# Patient Record
Sex: Male | Born: 1995 | Race: Black or African American | Hispanic: No | Marital: Single | Smoking: Never smoker
Health system: Southern US, Community
[De-identification: ages and names within clinical notes are randomized; demographics above are authoritative.]

## PROBLEM LIST (undated history)

## (undated) DIAGNOSIS — J45909 Unspecified asthma, uncomplicated: Secondary | ICD-10-CM

---

## 2018-01-20 ENCOUNTER — Encounter (HOSPITAL_COMMUNITY): Payer: Self-pay | Admitting: *Deleted

## 2018-01-20 ENCOUNTER — Emergency Department (HOSPITAL_COMMUNITY)
Admission: EM | Admit: 2018-01-20 | Discharge: 2018-01-20 | Disposition: A | Discharge status: Home / Self Care | Payer: Self-pay | Attending: Emergency Medicine | Admitting: Emergency Medicine

## 2018-01-20 DIAGNOSIS — R51 Headache: Secondary | ICD-10-CM | POA: Insufficient documentation

## 2018-01-20 DIAGNOSIS — J45909 Unspecified asthma, uncomplicated: Secondary | ICD-10-CM | POA: Insufficient documentation

## 2018-01-20 DIAGNOSIS — R519 Headache, unspecified: Secondary | ICD-10-CM

## 2018-01-20 HISTORY — DX: Unspecified asthma, uncomplicated: J45.909

## 2018-01-20 MED ORDER — DEXAMETHASONE 4 MG PO TABS
10.0000 mg | ORAL_TABLET | Freq: Once | ORAL | Status: AC
  Administered 2018-01-20: 10 mg via ORAL
  Filled 2018-01-20: qty 3

## 2018-01-20 MED ORDER — AMOXICILLIN-POT CLAVULANATE 875-125 MG PO TABS: 1.0000 | ORAL_TABLET | Freq: Two times a day (BID) | ORAL | 0 refills | Status: AC

## 2018-01-20 MED ORDER — PROCHLORPERAZINE EDISYLATE 10 MG/2ML IJ SOLN
10.0000 mg | Freq: Once | INTRAMUSCULAR | Status: AC
  Administered 2018-01-20: 10 mg via INTRAMUSCULAR
  Filled 2018-01-20: qty 2

## 2018-01-20 MED ORDER — DIPHENHYDRAMINE HCL 50 MG/ML IJ SOLN
25.0000 mg | Freq: Once | INTRAMUSCULAR | Status: AC
  Administered 2018-01-20: 25 mg via INTRAMUSCULAR
  Filled 2018-01-20: qty 1

## 2018-01-20 NOTE — ED Triage Notes (Signed)
Pt in c/o headache that started yesterday with pain to his eyes, also weakness today, hx of headache but states this feels different, no distress noted

## 2018-01-20 NOTE — ED Provider Notes (Signed)
[facility] EMERGENCY DEPARTMENT Provider Note   CSN: [id number] Arrival date & time: 01/20/18  1010     History   Chief Complaint Chief Complaint  Patient presents with  . Headache    HPI Thomas Merritt is a 22 y.o. male.  22 yo M with a chief complaint of a headache.  This been going on for the past 3 days.  Slow in onset.  Seems to be persistently there, worse with eye movement especially upward.  He denies any double vision or change in vision.  Denies head injury.  Denies vomiting.  Denies neck pain or fevers.  Denies cough or congestion.  Denies history of prior headaches.  Denies difficulty with speech or swallowing denies unilateral numbness or weakness.  The history is provided by the patient.  Headache   This is a new problem. The current episode started more than 2 days ago. The problem occurs constantly. The problem has been gradually worsening. Associated with: eye movement. The pain is located in the frontal region. The quality of the pain is described as dull. The pain is at a severity of 7/10. The pain is moderate. The pain does not radiate. Pertinent negatives include no fever, no palpitations, no shortness of breath and no vomiting. He has tried nothing for the symptoms. The treatment provided no relief.    Past Medical History:  Diagnosis Date  . Asthma     There are no active problems to display for this patient.   History reviewed. No pertinent surgical history.      Home Medications    Prior to Admission medications   Medication Sig Start Date End Date Taking? Authorizing Provider  amoxicillin-clavulanate (AUGMENTIN) 875-125 MG tablet Take 1 tablet by mouth 2 (two) times daily for 10 days. 01/20/18 01/30/18  Melene Plan, DO    Family History History reviewed. No pertinent family history.  Social History Social History   Tobacco Use  . Smoking status: Never Smoker  . Smokeless tobacco: Never Used  Substance Use Topics    . Alcohol use: Not on file  . Drug use: Not on file     Allergies   Patient has no known allergies.   Review of Systems Review of Systems  Constitutional: Negative for chills and fever.  HENT: Negative for congestion and facial swelling.   Eyes: Negative for discharge and visual disturbance.  Respiratory: Negative for shortness of breath.   Cardiovascular: Negative for chest pain and palpitations.  Gastrointestinal: Negative for abdominal pain, diarrhea and vomiting.  Musculoskeletal: Negative for arthralgias and myalgias.  Skin: Negative for color change and rash.  Neurological: Positive for headaches. Negative for tremors and syncope.  Psychiatric/Behavioral: Negative for confusion and dysphoric mood.     Physical Exam Updated Vital Signs BP 131/70   Pulse 78   Temp 98 F (36.7 C) (Oral)   Resp 18   Ht 5\' 9"  (1.753 m)   Wt 77.1 kg (170 lb)   SpO2 100%   BMI 25.10 kg/m   Physical Exam  Constitutional: He is oriented to person, place, and time. He appears well-developed and well-nourished.  HENT:  Head: Normocephalic and atraumatic.  Bilateral effusion to TMs sinus congestion no sinus tenderness to percussion.  Eyes: Pupils are equal, round, and reactive to light. EOM are normal.  Neck: Normal range of motion. Neck supple. No JVD present.  Cardiovascular: Normal rate and regular rhythm. Exam reveals no gallop and no friction rub.  No murmur heard.  Pulmonary/Chest: No respiratory distress. He has no wheezes.  Abdominal: He exhibits no distension. There is no rebound and no guarding.  Musculoskeletal: Normal range of motion.  Neurological: He is alert and oriented to person, place, and time. He has normal strength. No cranial nerve deficit or sensory deficit. He displays a negative Romberg sign. Coordination and gait normal. GCS eye subscore is 4. GCS verbal subscore is 5. GCS motor subscore is 6.  Patient ambulates without difficulty a benign neuro exam  Skin: No  rash noted. No pallor.  Psychiatric: He has a normal mood and affect. His behavior is normal.  Nursing note and vitals reviewed.    ED Treatments / Results  Labs (all labs ordered are listed, but only abnormal results are displayed) Labs Reviewed - No data to display  EKG None  Radiology No results found.  Procedures Procedures (including critical care time)  Medications Ordered in ED Medications  prochlorperazine (COMPAZINE) injection 10 mg (10 mg Intramuscular Given 01/20/18 1310)  diphenhydrAMINE (BENADRYL) injection 25 mg (25 mg Intramuscular Given 01/20/18 1311)  dexamethasone (DECADRON) tablet 10 mg (10 mg Oral Given 01/20/18 1310)     Initial Impression / Assessment and Plan / ED Course  I have reviewed the triage vital signs and the nursing notes.  Pertinent labs & imaging results that were available during my care of the patient were reviewed by me and considered in my medical decision making (see chart for details).     22 yo M with a chief complaint of a headache.  He describes this is behind his eyes and his forehead.  Worse when he looks up.  He denies any double vision denies trauma denies fever denies nasal congestion.  On my exam patient has significant nasal congestion he has no tenderness on percussion to the sinuses.  He has mild effusion to bilateral TMs without erythema or bulging.  I suspect that the patient has sinusitis we will give him a headache cocktail.  Neuro exam is benign.   Patient is feeling much better on reassessment.  Requesting discharge.  Will start on Augmentin for presumed sinusitis.  PCP follow-up.  Patient was given neurology follow-up as needed.  2:05 PM:  I have discussed the diagnosis/risks/treatment options with the patient and family and believe the pt to be eligible for discharge home to follow-up with PCP, neuro. We also discussed returning to the ED immediately if new or worsening sx occur. We discussed the sx which are most  concerning (e.g., sudden worsening pain, fever, inability to tolerate by mouth) that necessitate immediate return. Medications administered to the patient during their visit and any new prescriptions provided to the patient are listed below.  Medications given during this visit Medications  prochlorperazine (COMPAZINE) injection 10 mg (10 mg Intramuscular Given 01/20/18 1310)  diphenhydrAMINE (BENADRYL) injection 25 mg (25 mg Intramuscular Given 01/20/18 1311)  dexamethasone (DECADRON) tablet 10 mg (10 mg Oral Given 01/20/18 1310)      The patient appears reasonably screen and/or stabilized for discharge and I doubt any other medical condition or other [facility] requiring further screening, evaluation, or treatment in the ED at this time prior to discharge.   Final Clinical Impressions(s) / ED Diagnoses   Final diagnoses:  Sinus headache    ED Discharge Orders        Ordered    amoxicillin-clavulanate (AUGMENTIN) 875-125 MG tablet  2 times daily     01/20/18 1403    Ambulatory referral to Neurology  Comments:  New headache   01/20/18 1403       Melene PlanFloyd, Neev Mcmains, DO 01/20/18 1405

## 2018-02-11 ENCOUNTER — Emergency Department (HOSPITAL_COMMUNITY)
Admission: EM | Admit: 2018-02-11 | Discharge: 2018-02-11 | Disposition: A | Discharge status: Home / Self Care | Payer: Medicaid Other | Attending: Emergency Medicine | Admitting: Emergency Medicine

## 2018-02-11 ENCOUNTER — Emergency Department (HOSPITAL_COMMUNITY): Payer: Medicaid Other

## 2018-02-11 ENCOUNTER — Encounter (HOSPITAL_COMMUNITY): Payer: Self-pay | Admitting: Emergency Medicine

## 2018-02-11 ENCOUNTER — Other Ambulatory Visit: Payer: Self-pay

## 2018-02-11 DIAGNOSIS — Y929 Unspecified place or not applicable: Secondary | ICD-10-CM | POA: Insufficient documentation

## 2018-02-11 DIAGNOSIS — Y999 Unspecified external cause status: Secondary | ICD-10-CM | POA: Insufficient documentation

## 2018-02-11 DIAGNOSIS — S60221A Contusion of right hand, initial encounter: Secondary | ICD-10-CM

## 2018-02-11 DIAGNOSIS — S60511A Abrasion of right hand, initial encounter: Secondary | ICD-10-CM | POA: Insufficient documentation

## 2018-02-11 DIAGNOSIS — W228XXA Striking against or struck by other objects, initial encounter: Secondary | ICD-10-CM | POA: Insufficient documentation

## 2018-02-11 DIAGNOSIS — Y939 Activity, unspecified: Secondary | ICD-10-CM | POA: Insufficient documentation

## 2018-02-11 DIAGNOSIS — J45909 Unspecified asthma, uncomplicated: Secondary | ICD-10-CM | POA: Insufficient documentation

## 2018-02-11 MED ORDER — NAPROXEN 500 MG PO TABS: 500.0000 mg | ORAL_TABLET | Freq: Two times a day (BID) | ORAL | 0 refills | Status: DC

## 2018-02-11 MED ORDER — BACITRACIN ZINC 500 UNIT/GM EX OINT
TOPICAL_OINTMENT | Freq: Once | CUTANEOUS | Status: AC
  Administered 2018-02-11: 2 via TOPICAL
  Filled 2018-02-11: qty 1.8

## 2018-02-11 MED ORDER — HYDROCODONE-ACETAMINOPHEN 5-325 MG PO TABS
1.0000 | ORAL_TABLET | Freq: Once | ORAL | Status: AC
  Administered 2018-02-11: 1 via ORAL
  Filled 2018-02-11: qty 1

## 2018-02-11 MED ORDER — IBUPROFEN 200 MG PO TABS
600.0000 mg | ORAL_TABLET | Freq: Once | ORAL | Status: AC
  Administered 2018-02-11: 600 mg via ORAL
  Filled 2018-02-11: qty 1

## 2018-02-11 NOTE — ED Triage Notes (Signed)
Pt to ER for evaluation of right hand pain after punching a wall. Ace wrap in place. Pt in NAD. VSS.

## 2018-02-11 NOTE — ED Provider Notes (Signed)
[facility] EMERGENCY DEPARTMENT Provider Note   CSN: [id number] Arrival date & time: 02/11/18  1451     History   Chief Complaint Chief Complaint  Patient presents with  . Hand Pain    HPI Nikolis Jamar Gladden is a 22 y.o. male who presents to the ED with right hand pain. Patient reports that last night he got upset and hit a wall with his fist. Today there is increased pain and swelling. Patient also reports abrasions to his fingers. He states he is up to date on tetanus.   HPI  Past Medical History:  Diagnosis Date  . Asthma     There are no active problems to display for this patient.   History reviewed. No pertinent surgical history.      Home Medications    Prior to Admission medications   Medication Sig Start Date End Date Taking? Authorizing Provider  naproxen (NAPROSYN) 500 MG tablet Take 1 tablet (500 mg total) by mouth 2 (two) times daily. 02/11/18   Janne Napoleon, NP    Family History History reviewed. No pertinent family history.  Social History Social History   Tobacco Use  . Smoking status: Never Smoker  . Smokeless tobacco: Never Used  Substance Use Topics  . Alcohol use: Not on file  . Drug use: Not on file     Allergies   Patient has no known allergies.   Review of Systems Review of Systems  Musculoskeletal: Positive for arthralgias.  Skin: Positive for wound.  All other systems reviewed and are negative.    Physical Exam Updated Vital Signs BP 128/85 (BP Location: Left Arm)   Pulse 64   Temp 98.1 F (36.7 C) (Oral)   Resp 16   SpO2 100%   Physical Exam  Constitutional: He appears well-developed and well-nourished. No distress.  HENT:  Head: Normocephalic.  Eyes: EOM are normal.  Neck: Normal range of motion. Neck supple.  Cardiovascular: Normal rate.  Pulmonary/Chest: Effort normal.  Musculoskeletal:       Right hand: He exhibits tenderness and swelling. Normal sensation noted. Normal strength  noted. He exhibits no thumb/finger opposition.  Radial pulses 2+, adequate circulation. Superficial wounds to the dorsum of the middle finger and the web space of the 3rd and 4th fingers.   Neurological: He is alert.  Skin: Skin is warm and dry.  Psychiatric: He has a normal mood and affect.  Nursing note and vitals reviewed.    ED Treatments / Results  Labs (all labs ordered are listed, but only abnormal results are displayed) Labs Reviewed - No data to display Radiology Dg Hand Complete Right  Result Date: 02/11/2018 CLINICAL DATA:  Punched wall with hand pain, initial encounter EXAM: RIGHT HAND - COMPLETE 3+ VIEW COMPARISON:  None. FINDINGS: Considerable soft tissue swelling is noted in the hand. No acute fracture or dislocation is noted. No other focal abnormality is seen. IMPRESSION: Soft tissue swelling without bony abnormality. Electronically Signed   By: Alcide Clever M.D.   On: 02/11/2018 15:41    Procedures Procedures (including critical care time)  Medications Ordered in ED Medications  bacitracin ointment (has no administration in time range)  HYDROcodone-acetaminophen (NORCO/VICODIN) 5-325 MG per tablet 1 tablet (has no administration in time range)  ibuprofen (ADVIL,MOTRIN) tablet 600 mg (has no administration in time range)     Initial Impression / Assessment and Plan / ED Course  I have reviewed the triage vital signs and the nursing notes. 22  y.o. male here with right hand pain after he punched a wall last night stable for d/c without fracture or dislocation noted on x-ray. Patient treated for pain while in the ED. Wound care, and bacitracin ointment and dressing. Ace wrap, ice, elevation and NSAIDS. Patient to f/u with ortho if symptoms persist.  Final Clinical Impressions(s) / ED Diagnoses   Final diagnoses:  Contusion of right hand, initial encounter  Abrasion of right hand, initial encounter    ED Discharge Orders        Ordered    naproxen (NAPROSYN) 500  MG tablet  2 times daily     02/11/18 1703       Kerrie Buffaloeese, Evaan Tidwell HaralsonM, [place]NP 02/11/18 1706    Linwood DibblesKnapp, Jon, MD 02/12/18 0021

## 2019-03-06 ENCOUNTER — Telehealth: Payer: Medicaid Other | Admitting: Critical Care Medicine

## 2019-03-06 NOTE — Progress Notes (Deleted)
Patient ID: Thomas Merritt, male   DOB: July 03, 1996, 23 y.o.   MRN: [id number] Virtual Visit via Video Note  I connected with@ on 03/06/19 at@ by a video enabled telemedicine application and verified that I am speaking with the correct person using two identifiers.   Consent:  I discussed the limitations, risks, security and privacy concerns of performing an evaluation and management service by video visit and the availability of in person appointments. I also discussed with the patient that there may be a patient responsible charge related to this service. The patient expressed understanding and agreed to proceed.  Location of patient:  Location of provider:  Persons participating in the televisit with the patient.       History of Present Illness:    Observations/Objective:   Assessment and Plan:   Follow Up Instructions:    I discussed the assessment and treatment plan with the patient. The patient was provided an opportunity to ask questions and all were answered. The patient agreed with the plan and demonstrated an understanding of the instructions.   The patient was advised to call back or seek an in-person evaluation if the symptoms worsen or if the condition fails to improve as anticipated.  I provided *** minutes of non-face-to-face time during this encounter  including  median intraservice time , review of notes, labs, imaging, medications  and explaining diagnosis and management to the patient .    Asencion Noble, MD

## 2019-04-03 ENCOUNTER — Telehealth: Payer: Medicaid Other | Admitting: Critical Care Medicine

## 2019-04-03 NOTE — Progress Notes (Deleted)
Patient ID: [name], male   DOB: 03/16/1996, 23 y.o.   MRN: [id number] Virtual Visit via Video Note  I connected with@ on 04/03/19 at@ by a video enabled telemedicine application and verified that I am speaking with the correct person using two identifiers.   Consent:  I discussed the limitations, risks, security and privacy concerns of performing an evaluation and management service by video visit and the availability of in person appointments. I also discussed with the patient that there may be a patient responsible charge related to this service. The patient expressed understanding and agreed to proceed.  Location of patient:  Location of provider:  Persons participating in the televisit with the patient.       History of Present Illness: In ED 8/4 after hit wall with fist.    have reviewed the triage vital signs and the nursing notes. 23 y.o. male here with right hand pain after he punched a wall last night stable for d/c without fracture or dislocation noted on x-ray. Patient treated for pain while in the ED. Wound care, and bacitracin ointment and dressing. Ace wrap, ice, elevation and NSAIDS. Patient to f/u with ortho if symptoms persist.   Observations/Objective:   Assessment and Plan:   Follow Up Instructions:    I discussed the assessment and treatment plan with the patient. The patient was provided an opportunity to ask questions and all were answered. The patient agreed with the plan and demonstrated an understanding of the instructions.   The patient was advised to call back or seek an in-person evaluation if the symptoms worsen or if the condition fails to improve as anticipated.  I provided *** minutes of non-face-to-face time during this encounter  including  median intraservice time , review of notes, labs, imaging, medications  and explaining diagnosis and management to the patient .    Asencion Noble, MD

## 2019-07-05 ENCOUNTER — Emergency Department (HOSPITAL_COMMUNITY)
Admission: EM | Admit: 2019-07-05 | Discharge: 2019-07-05 | Disposition: A | Discharge status: Home / Self Care | Payer: Medicaid Other | Attending: Emergency Medicine | Admitting: Emergency Medicine

## 2019-07-05 ENCOUNTER — Encounter (HOSPITAL_COMMUNITY): Payer: Self-pay

## 2019-07-05 ENCOUNTER — Other Ambulatory Visit: Payer: Self-pay

## 2019-07-05 DIAGNOSIS — Z20828 Contact with and (suspected) exposure to other viral communicable diseases: Secondary | ICD-10-CM | POA: Insufficient documentation

## 2019-07-05 DIAGNOSIS — J45909 Unspecified asthma, uncomplicated: Secondary | ICD-10-CM | POA: Insufficient documentation

## 2019-07-05 DIAGNOSIS — R45851 Suicidal ideations: Secondary | ICD-10-CM | POA: Insufficient documentation

## 2019-07-05 DIAGNOSIS — R0602 Shortness of breath: Secondary | ICD-10-CM | POA: Insufficient documentation

## 2019-07-05 DIAGNOSIS — F322 Major depressive disorder, single episode, severe without psychotic features: Secondary | ICD-10-CM | POA: Insufficient documentation

## 2019-07-05 DIAGNOSIS — R0789 Other chest pain: Secondary | ICD-10-CM | POA: Insufficient documentation

## 2019-07-05 DIAGNOSIS — F4323 Adjustment disorder with mixed anxiety and depressed mood: Secondary | ICD-10-CM

## 2019-07-05 LAB — RAPID URINE DRUG SCREEN, HOSP PERFORMED
Amphetamines: NOT DETECTED
Barbiturates: NOT DETECTED
Benzodiazepines: NOT DETECTED
Cocaine: NOT DETECTED
Opiates: NOT DETECTED
Tetrahydrocannabinol: NOT DETECTED

## 2019-07-05 LAB — COMPREHENSIVE METABOLIC PANEL
ALT: 18 U/L (ref 0–44)
AST: 21 U/L (ref 15–41)
Albumin: 4.7 g/dL (ref 3.5–5.0)
Alkaline Phosphatase: 59 U/L (ref 38–126)
Anion gap: 6 (ref 5–15)
BUN: 15 mg/dL (ref 6–20)
CO2: 30 mmol/L (ref 22–32)
Calcium: 9.3 mg/dL (ref 8.9–10.3)
Chloride: 101 mmol/L (ref 98–111)
Creatinine, Ser: 0.82 mg/dL (ref 0.61–1.24)
GFR calc Af Amer: >60 mL/min (ref >60)
GFR calc non Af Amer: >60 mL/min (ref >60)
Glucose, Bld: 95 mg/dL (ref 70–99)
Potassium: 3.3 mmol/L — ABNORMAL LOW (ref 3.5–5.1)
Sodium: 137 mmol/L (ref 135–145)
Total Bilirubin: 1.3 mg/dL — ABNORMAL HIGH (ref 0.3–1.2)
Total Protein: 8.1 g/dL (ref 6.5–8.1)

## 2019-07-05 LAB — SALICYLATE LEVEL: Salicylate Lvl: <7 mg/dL — ABNORMAL LOW (ref 7.0–30.0)

## 2019-07-05 LAB — CBC
HCT: 43.1 % (ref 39.0–52.0)
Hemoglobin: 13.9 g/dL (ref 13.0–17.0)
MCH: 31.5 pg (ref 26.0–34.0)
MCHC: 32.3 g/dL (ref 30.0–36.0)
MCV: 97.7 fL (ref 80.0–100.0)
Platelets: 233 10*3/uL (ref 150–400)
RBC: 4.41 MIL/uL (ref 4.22–5.81)
RDW: 11.8 % (ref 11.5–15.5)
WBC: 10.9 10*3/uL — ABNORMAL HIGH (ref 4.0–10.5)
nRBC: 0 % (ref 0.0–0.2)

## 2019-07-05 LAB — RESPIRATORY PANEL BY RT PCR (FLU A&B, COVID)
Influenza A by PCR: NEGATIVE
Influenza B by PCR: NEGATIVE
SARS Coronavirus 2 by RT PCR: NEGATIVE

## 2019-07-05 LAB — ACETAMINOPHEN LEVEL: Acetaminophen (Tylenol), Serum: <10 ug/mL — ABNORMAL LOW (ref 10–30)

## 2019-07-05 LAB — ETHANOL: Alcohol, Ethyl (B): <10 mg/dL (ref <10)

## 2019-07-05 LAB — TROPONIN I (HIGH SENSITIVITY): Troponin I (High Sensitivity): 3 ng/L (ref <18)

## 2019-07-05 MED ORDER — ONDANSETRON HCL 4 MG PO TABS: 4.0000 mg | ORAL_TABLET | Freq: Three times a day (TID) | ORAL | Status: DC | PRN

## 2019-07-05 MED ORDER — ALUM & MAG HYDROXIDE-SIMETH 200-200-20 MG/5ML PO SUSP: 30.0000 mL | Freq: Four times a day (QID) | ORAL | Status: DC | PRN

## 2019-07-05 MED ORDER — ACETAMINOPHEN 325 MG PO TABS
650.0000 mg | ORAL_TABLET | ORAL | Status: DC | PRN
  Administered 2019-07-05: 650 mg via ORAL
  Filled 2019-07-05: qty 2

## 2019-07-05 NOTE — ED Notes (Signed)
Pt has Observation bed at [facility] pending neg COVID-19  WNL cardiac

## 2019-07-05 NOTE — ED Notes (Signed)
TTS: evaluated pt hold for over night observation

## 2019-07-05 NOTE — ED Provider Notes (Addendum)
[place] DEPT Provider Note: Georgena Spurling, MD, FACEP  CSN: [id number] MRN: [id number] ARRIVAL: 07/05/19 at [place]: [place]  Suicidal   HISTORY OF PRESENT ILLNESS  07/05/19 2:35 AM Thomas Merritt is a 23 y.o. male with suicidal thoughts.  He has had increased anxiety over the past week but does not wish to discuss the stressors that her triggering it.  He has had 3 days of chest pressure which is constant.  He has also had intermittent shortness of breath.  He is having suicidal ideation and EMS reports the patient had a knife and he stated he was willing to kill himself.  He rates his chest pressure as a 9 out of 10.   Past Medical History:  Diagnosis Date  . Asthma     History reviewed. No pertinent surgical history.  No family history on file.  Social History   Tobacco Use  . Smoking status: Never Smoker  . Smokeless tobacco: Never Used  Substance Use Topics  . Alcohol use: Not on file  . Drug use: Not on file    Prior to Admission medications   Not on File    Allergies Patient has no known allergies.   REVIEW OF SYSTEMS  Negative except as noted here or in the History of Present Illness.   PHYSICAL EXAMINATION  Initial Vital Signs Blood pressure 133/88, pulse 83, temperature 98.6 F (37 C), temperature source Oral, resp. rate 16, height 5\' 9"  (1.753 m), weight 77.1 kg, SpO2 100 %.  Examination General: Well-developed, well-nourished male in no acute distress; appearance consistent with age of record HENT: normocephalic; atraumatic Eyes: pupils equal, round and reactive to light; extraocular muscles intact Neck: supple Heart: regular rate and rhythm Lungs: clear to auscultation bilaterally Abdomen: soft; nondistended; nontender; bowel sounds present Extremities: No deformity; full range of motion; pulses normal Neurologic: Awake, alert; motor function intact in all extremities and symmetric; no facial droop Skin:  Warm and dry Psychiatric: Depressed mood with congruent affect; suicidal ideation   RESULTS  Summary of this visit's results, reviewed and interpreted by myself:   EKG Interpretation  Date/Time:  Thursday July 05 2019 02:35:14 EST Ventricular Rate:  87 PR Interval:    QRS Duration: 108 QT Interval:  361 QTC Calculation: 435 R Axis:   16 Text Interpretation: Sinus rhythm ST elev, probable normal early repol pattern No previous ECGs available Confirmed by Shanon Rosser [phone]) on 07/05/2019 2:38:27 AM      Laboratory Studies: Results for orders placed or performed during the hospital encounter of 07/05/19 (from the past 24 hour(s))  Comprehensive metabolic panel     Status: Abnormal   Collection Time: 07/05/19  2:39 AM  Result Value Ref Range   Sodium 137 135 - 145 mmol/L   Potassium 3.3 (L) 3.5 - 5.1 mmol/L   Chloride 101 98 - 111 mmol/L   CO2 30 22 - 32 mmol/L   Glucose, Bld 95 70 - 99 mg/dL   BUN 15 6 - 20 mg/dL   Creatinine, Ser 0.82 0.61 - 1.24 mg/dL   Calcium 9.3 8.9 - 10.3 mg/dL   Total Protein 8.1 6.5 - 8.1 g/dL   Albumin 4.7 3.5 - 5.0 g/dL   AST 21 15 - 41 U/L   ALT 18 0 - 44 U/L   Alkaline Phosphatase 59 38 - 126 U/L   Total Bilirubin 1.3 (H) 0.3 - 1.2 mg/dL   GFR calc non Af Amer >60 >60 mL/min  GFR calc Af Amer >60 >60 mL/min   Anion gap 6 5 - 15  Ethanol     Status: None   Collection Time: 07/05/19  2:39 AM  Result Value Ref Range   Alcohol, Ethyl (B) <10 <10 mg/dL  Salicylate level     Status: Abnormal   Collection Time: 07/05/19  2:39 AM  Result Value Ref Range   Salicylate Lvl <7.0 (L) 7.0 - 30.0 mg/dL  Acetaminophen level     Status: Abnormal   Collection Time: 07/05/19  2:39 AM  Result Value Ref Range   Acetaminophen (Tylenol), Serum <10 (L) 10 - 30 ug/mL  cbc     Status: Abnormal   Collection Time: 07/05/19  2:39 AM  Result Value Ref Range   WBC 10.9 (H) 4.0 - 10.5 K/uL   RBC 4.41 4.22 - 5.81 MIL/uL   Hemoglobin 13.9 13.0 - 17.0 g/dL    HCT 28.7 86.7 - 67.2 %   MCV 97.7 80.0 - 100.0 fL   MCH 31.5 26.0 - 34.0 pg   MCHC 32.3 30.0 - 36.0 g/dL   RDW 09.4 70.9 - 62.8 %   Platelets 233 150 - 400 K/uL   nRBC 0.0 0.0 - 0.2 %  Rapid urine drug screen (hospital performed)     Status: None   Collection Time: 07/05/19  2:39 AM  Result Value Ref Range   Opiates NONE DETECTED NONE DETECTED   Cocaine NONE DETECTED NONE DETECTED   Benzodiazepines NONE DETECTED NONE DETECTED   Amphetamines NONE DETECTED NONE DETECTED   Tetrahydrocannabinol NONE DETECTED NONE DETECTED   Barbiturates NONE DETECTED NONE DETECTED  Troponin I (High Sensitivity)     Status: None   Collection Time: 07/05/19  2:40 AM  Result Value Ref Range   Troponin I (High Sensitivity) 3 <18 ng/L  Respiratory Panel by RT PCR (Flu A&B, Covid) - Nasopharyngeal Swab     Status: None   Collection Time: 07/05/19  3:55 AM   Specimen: Nasopharyngeal Swab  Result Value Ref Range   SARS Coronavirus 2 by RT PCR NEGATIVE NEGATIVE   Influenza A by PCR NEGATIVE NEGATIVE   Influenza B by PCR NEGATIVE NEGATIVE   Imaging Studies: No results found.  ED COURSE and MDM  Nursing notes, initial and subsequent vitals signs, including pulse oximetry, reviewed and interpreted by myself.  Vitals:   07/05/19 0228 07/05/19 0237 07/05/19 0650  BP:  133/88 (!) 111/48  Pulse:  83 79  Resp:  16 16  Temp:  98.6 F (37 C) 98.3 F (36.8 C)  TempSrc:  Oral   SpO2: 98% 100% 98%  Weight:  77.1 kg   Height:  5\' 9"  (1.753 m)    Medications  acetaminophen (TYLENOL) tablet 650 mg (has no administration in time range)  ondansetron (ZOFRAN) tablet 4 mg (has no administration in time range)  alum & mag hydroxide-simeth (MAALOX/MYLANTA) 200-200-20 MG/5ML suspension 30 mL (has no administration in time range)   4:27 AM Awaiting TTS evaluation.  PROCEDURES  Procedures   ED DIAGNOSES     ICD-10-CM   1. Suicidal ideation  R45.851        , MD 07/05/19 0427    07/07/19, MD 07/05/19 [phone]

## 2019-07-05 NOTE — ED Notes (Signed)
Patients personal items in belongings bag, patient dressed out into burgundy scrubs and yellow socks, wanded by security.

## 2019-07-05 NOTE — Discharge Instructions (Signed)
For your ongoing behavioral health needs you are advised to follow up at your earliest opportunity with an outpatient therapist.  [facility] offers these services to students.  Contact them at the number listed below.  Alternatives are also listed:       [facility]      [address]      [phone]       [facility] [place], [place] [address]      [phone]      New patients are seen at their walk-in clinic.  Walk-in hours are Monday - Friday from 8:30 am - 12:00 pm, and from 1:00 pm - 2:30 pm.  Walk-in patients are seen on a first come, first served basis, so try to arrive as early as possible for the best chance of being seen the same day.       [facility]      [place],  [address]      [phone]

## 2019-07-05 NOTE — BH Assessment (Signed)
[place] Assessment Progress Note  Per Hampton Abbot, MD and Earleen Newport, FNP, this pt does not require psychiatric hospitalization at this time.  Pt is to be discharged from [facility] with recommendation to follow up with an outpatient therapist.  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  Discharge instructions include contact information for the [place] [facility], along with [place] and the [facility] as alternatives.  Pt's nurse, Erline Levine, has been notified.  Jalene Mullet, [place] Triage Specialist [phone]

## 2019-07-05 NOTE — BH Assessment (Signed)
Tele Assessment Note   Patient Name: Thomas MangleDenzel Jamar Merritt MRN: [id number] Referring Physician: Dr. Paula LibraJohn Molpus. Location of Patient: [facility], [phone].  Location of Provider: Behavioral Health TTS Department  Thomas Merritt is an 23 y.o. male, who [facility] voluntary and [facility]accompan[facility]. Clinician asked the pt, "what brought you to the hospital?" Pt reported, "a lot problems going on." Pt reported, he knew his (now) ex-girlfriend since 2015, and in 2017 they began dating secretly because he does not like people in his business. Pt reported, he has lied and cheated during their relationship but he and his ex eventually made up. Pt reported, he and his ex-girlfriend said they were going to be celibate but he cheated and on 06/16/2019 his ex said she was done and broke up with the pt. Pt reported, two days ago, he found out his ex had a boyfriend. Per pt, in the past his ex would say she was done after he had cheated, she would talk to other guys but nothing serious. Pt reported, he talked to his ex  to get closure and learned his ex and her boyfriend were having sex. Pt reported, missing his father (who passed away in 2013) and wanting his guidance on how to deal with relationship problems. Pt reported, he put a knife to his neck. Pt reported, he wanted to do it then he didn't want to do it. Pt reported, he needed to get out if his house and called 911. Pt denies, HI, AVH, self-injurious behaviors.   Pt denies, history of abuse. Pt denies, substance use. Pt's UDS is pending. Pt denies, being linked to OPT resources (medication management and/or counseling.) Pt reported, wanting a emotional support animal and how to start the process. Pt denies, previous inpatient admissions.   Pt presents alert wrapped in cover with logical, coherent speech. Pt's mood, affect was depressed. Pt's thought process was coherent, relevant. Pt was oriented x4. Pt's concentration was normal. Pt's insight was good.  Pt's impulse control was poor. Pt reported, [facility] he could contract for safety. Clinician discussed the three possible dispositions (discharged with OPT resources, observe/reassess by psychiatry or inpatient treatment) in detail. Pt reported, if inpatient treatment is recommended he would sign-in voluntarily.  *Pt declined having family, friend support to be called to obtain collateral information.*   Diagnosis: Major Depressive Disorder, single, severe without psychotic features.   Past Medical History:  Past Medical History:  Diagnosis Date  . Asthma     History reviewed. No pertinent surgical history.  Family History: No family history on Merritt.  Social History:  reports that he has never smoked. He has never used smokeless tobacco. No history on Merritt for alcohol and drug.  Additional Social History:  Alcohol / Drug Use Pain Medications: See MAR Prescriptions: See MAR Over the Counter: See MAR History of alcohol / drug use?: (Pt denies use. UDS is pending.)  CIWA: CIWA-Ar BP: 133/88 Pulse Rate: 83 COWS:    Allergies: No Known Allergies  Home Medications: (Not in a hospital admission)   OB/GYN Status:  No LMP for male patient.  General Assessment Data Location of Assessment: [facility] TTS Assessment: In system Is this a Tele or Face-to-Face Assessment?: Tele Assessment Is this an Initial Assessment or a Re-assessment for this encounter?: Initial Assessment Patient Accompanied by:: N/A Language Other than English: No Living Arrangements: Other (Comment)(Three room mates. ) Marital status: Single Living Arrangements: Other relatives Can pt return to current living arrangement?:  Yes Admission Status: Voluntary Is patient capable of signing voluntary admission?: Yes Referral Source: [phone].) Insurance type: Self-pay.      Crisis Care Plan Living Arrangements: Other relatives Legal Guardian: Other:(Self-pay. ) Name of Psychiatrist: NA Name of  Therapist: NA  Education Status Is patient currently in school?: Yes Current Grade: Sophomore in college. Highest grade of school patient has completed: Freshman in college.  Name of school: [employer] [address]   Risk to self with the past 6 months Suicidal Ideation: Yes-Currently Present Has patient been a risk to self within the past 6 months prior to admission? : Yes Suicidal Intent: Yes-Currently Present Has patient had any suicidal intent within the past 6 months prior to admission? : Yes Is patient at risk for suicide?: Yes Suicidal Plan?: Yes-Currently Present Has patient had any suicidal plan within the past 6 months prior to admission? : Yes Specify Current Suicidal Plan: Pt put a knife to his neck.  Access to Means: Yes Specify Access to Suicidal Means: Knives.  What has been your use of drugs/alcohol within the last 12 months?: UDS is pending.  Previous Attempts/Gestures: No How many times?: 0 Other Self Harm Risks: NA Triggers for Past Attempts: None known Intentional Self Injurious Behavior: (Pt reported, he cut himself once and never again. ) Family Suicide History: No Recent stressful life event(s): Loss (Comment), Other (Comment)(Relationship problems, father died in 11/17/11.) Persecutory voices/beliefs?: No Depression: Yes Depression Symptoms: Guilt, Despondent, Fatigue(Per pt, "tired of life." ) Substance abuse history and/or treatment for substance abuse?: No Suicide prevention information given to non-admitted patients: Not applicable  Risk to Others within the past 6 months Homicidal Ideation: No(Pt denies. ) Does patient have any lifetime risk of violence toward others beyond the six months prior to admission? : No(Pt denies. ) Thoughts of Harm to Others: No(Pt denies. ) Current Homicidal Intent: No Current Homicidal Plan: No(Pt denies. ) Access to Homicidal Means: No Identified Victim: NA History of harm to others?: No(Pt denies.  ) Assessment of Violence: None Noted Violent Behavior Description: NA Does patient have access to weapons?: No(Pt denies. ) Criminal Charges Pending?: No Does patient have a court date: No Is patient on probation?: No  Psychosis Hallucinations: None noted(Pt denies. ) Delusions: None noted  Mental Status Report Appearance/Hygiene: Other (Comment)(Pt wrapped in covers. ) Eye Contact: Good Motor Activity: Unremarkable Speech: Logical/coherent Level of Consciousness: Alert Mood: Depressed Affect: Depressed Anxiety Level: Minimal Thought Processes: Coherent, Relevant Judgement: Partial Orientation: Person, Place, Time, Situation Obsessive Compulsive Thoughts/Behaviors: None  Cognitive Functioning Concentration: Normal Memory: Recent Intact Is patient IDD: No Insight: Good Impulse Control: Poor Appetite: Fair Have you had any weight changes? : No Change Sleep: Decreased Total Hours of Sleep: (Per pt, lately no sleeping.) Vegetative Symptoms: None  ADLScreening [facility] Assessment Services) Patient's cognitive ability adequate to safely complete daily activities?: Yes Patient able to express need for assistance with ADLs?: Yes Independently performs ADLs?: Yes (appropriate for developmental age)  Prior Inpatient Therapy Prior Inpatient Therapy: No  Prior Outpatient Therapy Prior Outpatient Therapy: No Does patient have an ACCT team?: No Does patient have Intensive In-House Services?  : No Does patient have [facility] services? : No Does patient have P4CC services?: No  ADL Screening (condition at time of admission) Patient's cognitive ability adequate to safely complete daily activities?: Yes Is the patient deaf or have difficulty hearing?: No Does the patient have difficulty seeing, even when wearing glasses/contacts?: No Does the patient have difficulty concentrating, remembering, or  making decisions?: No Patient able to express need for assistance with ADLs?: Yes Does  the patient have difficulty dressing or bathing?: No Independently performs ADLs?: Yes (appropriate for developmental age) Does the patient have difficulty walking or climbing stairs?: No Weakness of Legs: None(Pt reported, his chest is hurting a little bit.) Weakness of Arms/Hands: None  Home Assistive Devices/Equipment Home Assistive Devices/Equipment: None    Abuse/Neglect Assessment (Assessment to be complete while patient is alone) Abuse/Neglect Assessment Can Be Completed: Yes Physical Abuse: Denies Verbal Abuse: Denies Sexual Abuse: Denies Exploitation of patient/patient's resources: Denies Self-Neglect: Denies     Regulatory affairs officer (For Healthcare) Does Patient Have a Medical Advance Directive?: No Would patient like information on creating a medical advance directive?: No - Patient declined          Disposition: Adaku Anike, NP recommends overnight observation. Disposition discussed with Dr. Florina Ou and Mortimer Fries, RN. Pt has bed on OBS pending medical clearance and negative COVID test.    Disposition Initial Assessment Completed for this Encounter: Yes  This service was provided via telemedicine using a 2-way, interactive audio and video technology.  Names of all persons participating in this telemedicine service and their role in this encounter. Name: Thomas Merritt. Role: Patient.  Name: Vertell Novak, MS, [facility], [place]. Role: Counselor.          Vertell Novak 07/05/2019 4:31 AM    Vertell Novak, MS, [facility], [facility] Triage Specialist [phone]

## 2019-07-05 NOTE — ED Triage Notes (Addendum)
Patient arrived with [facility] stating he has had some anxiety over the past week due to stressors he is not willing to share at this time. Reporting chest tightness and feelings of shortness of breath throughout the week. Pt reports SI, per EMS patient had a knife and stating he was ready to kill himself.

## 2019-07-05 NOTE — BHH Suicide Risk Assessment (Cosign Needed)
Suicide Risk Assessment  Discharge Assessment   [facility] Discharge Suicide Risk Assessment   Principal Problem: Adjustment disorder with mixed anxiety and depressed mood Discharge Diagnoses: Principal Problem:   Adjustment disorder with mixed anxiety and depressed mood   Total Time spent with patient: 30 minutes  Musculoskeletal: Strength & Muscle Tone: within normal limits Gait & Station: normal Patient leans: N/A  Psychiatric Specialty Exam:   Blood pressure (!) 111/48, pulse 79, temperature 98.3 F (36.8 C), resp. rate 18, height 5\' 9"  (1.753 m), weight 77.1 kg, SpO2 98 %.Body mass index is 25.1 kg/m.  General Appearance: Casual  Eye Contact::  Good  Speech:  Clear and Coherent and Normal Rate409  Volume:  Normal  Mood:  "Good" Appropriate  Affect:  Appropriate and Congruent  Thought Process:  Coherent, Goal Directed and Descriptions of Associations: Intact  Orientation:  Full (Time, Place, and Person)  Thought Content:  WDL and Logical  Suicidal Thoughts:  No  Homicidal Thoughts:  No  Memory:  Immediate;   Good Recent;   Good  Judgement:  Intact  Insight:  Present  Psychomotor Activity:  Normal  Concentration:  Good  Recall:  Good  Fund of Knowledge:Good  Language: Good  Akathisia:  No  Handed:  Right  AIMS (if indicated):     Assets:  Communication Skills Desire for Improvement Housing Physical Health Social Support Transportation Vocational/Educational  Sleep:     Cognition: WNL  ADL's:  Intact   Mental Status Per Nursing Assessment::   On Admission:     Demographic Factors:  Male  Loss Factors: NA  Historical Factors: NA  Risk Reduction Factors:   Sense of responsibility to family, Religious beliefs about death, Living with another person, especially a relative and Positive social support  Continued Clinical Symptoms:  Adjustment disorder with depressed mood  Cognitive Features That Contribute To Risk:  None    Suicide Risk:  Minimal: No  identifiable suicidal ideation.  Patients presenting with no risk factors but with morbid ruminations; may be classified as minimal risk based on the severity of the depressive symptoms    Plan Of Care/Follow-up recommendations:  Activity:  As tolerated Diet:  As tolerated      Discharge Instructions     For your ongoing behavioral health needs you are advised to follow up at your earliest opportunity with an outpatient therapist.  [facility] offers these services to students.  Contact them at the number listed below.  Alternatives are also listed:       [facility]      [address]      [phone]       [facility]      [address] [place], [place]      [phone]      New patients are seen at their walk-in clinic.  Walk-in hours are Monday - Friday from 8:30 am - 12:00 pm, and from 1:00 pm - 2:30 pm.  Walk-in patients are seen on a first come, first served basis, so try to arrive as early as possible for the best chance of being seen the same day.       [facility]      [address] [place], [place]      [phone]    Disposition:  Patient psychiatrically cleared No evidence of imminent risk to self or others at present.   Patient does not meet criteria for psychiatric inpatient admission. Supportive therapy provided about ongoing stressors. Discussed crisis plan, support from social network, calling 911, coming to the Emergency Department, and calling Suicide Hotline.  Shariyah Eland, NP 07/05/2019, 11:17 AM

## 2019-07-05 NOTE — ED Notes (Signed)
TTS machine at bedside session in progress

## 2019-07-09 ENCOUNTER — Encounter: Payer: Self-pay | Admitting: Emergency Medicine

## 2019-07-09 ENCOUNTER — Other Ambulatory Visit: Payer: Self-pay

## 2019-07-09 ENCOUNTER — Ambulatory Visit
Admission: EM | Admit: 2019-07-09 | Discharge: 2019-07-09 | Disposition: A | Discharge status: Home / Self Care | Payer: Medicaid Other | Attending: Emergency Medicine | Admitting: Emergency Medicine

## 2019-07-09 DIAGNOSIS — Z113 Encounter for screening for infections with a predominantly sexual mode of transmission: Secondary | ICD-10-CM | POA: Insufficient documentation

## 2019-07-09 NOTE — Discharge Instructions (Addendum)

## 2019-07-09 NOTE — ED Triage Notes (Signed)
Pt presents to [facility] for assessment of STI testing, sexually active currently.  Denies symptoms or known exposure.  Denies concern for HIV or syphilis.

## 2019-07-09 NOTE — ED Provider Notes (Signed)
[name]    CSN: [id number] Arrival date & time: 07/09/19  1125      History   Chief Complaint Chief Complaint  Patient presents with  . STI Testing    HPI Thomas Merritt is a 23 y.o. male presenting for STD testing.  Denies testicular/penile pain, swelling, penile discharge, known STD contact.  Patient states is been 2 years since his last screening: Denies history of STD.  Currently sexually active, >1 partner, not wearing condoms every time.    Past Medical History:  Diagnosis Date  . Asthma     Patient Active Problem List   Diagnosis Date Noted  . Adjustment disorder with mixed anxiety and depressed mood 07/05/2019    History reviewed. No pertinent surgical history.     Home Medications    Prior to Admission medications   Not on File    Family History Family History  Problem Relation Age of Onset  . Diabetes Father   . Hypertension Father   . Renal Disease Father   . Asthma Father     Social History Social History   Tobacco Use  . Smoking status: Never Smoker  . Smokeless tobacco: Never Used  Substance Use Topics  . Alcohol use: Yes    Comment: rarely  . Drug use: Never     Allergies   Patient has no known allergies.   Review of Systems Review of Systems  Constitutional: Negative for fatigue and fever.  Gastrointestinal: Negative for abdominal pain and nausea.  Genitourinary: Negative for discharge, dysuria, frequency, genital sores, penile pain, penile swelling, scrotal swelling, testicular pain and urgency.  Musculoskeletal: Negative for arthralgias and myalgias.  Skin: Negative for color change and rash.     Physical Exam Triage Vital Signs ED Triage Vitals  Enc Vitals Group     BP 07/09/19 1130 (!) 143/88     Pulse Rate 07/09/19 1130 76     Resp 07/09/19 1130 16     Temp 07/09/19 1130 98 F (36.7 C)     Temp Source 07/09/19 1130 Temporal     SpO2 07/09/19 1130 96 %     Weight --      Height --        Head Circumference --      Peak Flow --      Pain Score 07/09/19 1131 0     Pain Loc --      Pain Edu? --      Excl. in [place]? --    No data found.  Updated Vital Signs BP (!) 143/88 (BP Location: Left Arm)   Pulse 76   Temp 98 F (36.7 C) (Temporal)   Resp 16   SpO2 96%   Visual Acuity Right Eye Distance:   Left Eye Distance:   Bilateral Distance:    Right Eye Near:   Left Eye Near:    Bilateral Near:     Physical Exam Exam conducted with a chaperone present.  Constitutional:      General: He is not in acute distress. HENT:     Head: Normocephalic and atraumatic.  Eyes:     General: No scleral icterus.    Pupils: Pupils are equal, round, and reactive to light.  Cardiovascular:     Rate and Rhythm: Normal rate.  Pulmonary:     Effort: Pulmonary effort is normal. No respiratory distress.     Breath sounds: No wheezing.  Genitourinary:    Penis: Normal.  Testes: Normal.     Comments: Cytology swab obtained by provider: Patient tolerated well.  No meatal irritation, penile discharge observed. Skin:    Coloration: Skin is not jaundiced or pale.  Neurological:     Mental Status: He is alert and oriented to person, place, and time.      UC Treatments / Results  Labs (all labs ordered are listed, but only abnormal results are displayed) Labs Reviewed  CYTOLOGY, (ORAL, ANAL, URETHRAL) ANCILLARY ONLY    EKG   Radiology No results found.  Procedures Procedures (including critical care time)  Medications Ordered in UC Medications - No data to display  Initial Impression / Assessment and Plan / UC Course  I have reviewed the triage vital signs and the nursing notes.  Pertinent labs & imaging results that were available during my care of the patient were reviewed by me and considered in my medical decision making (see chart for details).     STD panel pending: We will treat if indicated.  Reviewed safe sex practices as outlined below.  Return  precautions discussed, patient verbalized understanding and is agreeable to plan. Final Clinical Impressions(s) / UC Diagnoses   Final diagnoses:  Screening examination for venereal disease     Discharge Instructions     Testing for chlamydia, gonorrhea, trichomonas is pending: please look for these results on the MyChart app/website.  We will notify you if you are positive and outline treatment at that time.  Important to avoid all forms of sexual intercourse (oral, vaginal, anal) with any/all partners for the next 7 days to avoid spreading/reinfecting. Any/all sexual partners should be notified of testing/treatment today.  Return for persistent/worsening symptoms or if you develop fever, abdominal or pelvic pain, blood in your urine, or are re-exposed to an STI.    ED Prescriptions    None     PDMP not reviewed this encounter.   [place], [place], [place] 07/09/19 1153

## 2019-07-10 LAB — CYTOLOGY, (ORAL, ANAL, URETHRAL) ANCILLARY ONLY
Chlamydia: NEGATIVE
Neisseria Gonorrhea: NEGATIVE
Trichomonas: POSITIVE — AB

## 2019-07-11 ENCOUNTER — Telehealth: Payer: Self-pay | Admitting: Emergency Medicine

## 2019-07-11 MED ORDER — METRONIDAZOLE 500 MG PO TABS: 2000.0000 mg | ORAL_TABLET | Freq: Once | ORAL | 0 refills | Status: AC

## 2019-07-11 NOTE — Telephone Encounter (Signed)
Trichomonas is positive. Rx  for Flagyl 2 grams, once was sent to the pharmacy of record. Pt needs education to refrain from sexual intercourse for 7 days to give the medicine time to work. Sexual partners need to be notified and tested/treated. Condoms may reduce risk of reinfection. Recheck for further evaluation if symptoms are not improving.   Patient contacted by phone and made aware of    results. Pt verbalized understanding and had all questions answered.    

## 2019-08-16 ENCOUNTER — Ambulatory Visit
Admission: EM | Admit: 2019-08-16 | Discharge: 2019-08-16 | Disposition: A | Discharge status: Home / Self Care | Payer: Medicaid Other | Attending: Emergency Medicine | Admitting: Emergency Medicine

## 2019-08-16 ENCOUNTER — Other Ambulatory Visit: Payer: Self-pay

## 2019-08-16 ENCOUNTER — Encounter: Payer: Self-pay | Admitting: Emergency Medicine

## 2019-08-16 DIAGNOSIS — L290 Pruritus ani: Secondary | ICD-10-CM

## 2019-08-16 MED ORDER — ALBENDAZOLE 200 MG PO TABS: 400.0000 mg | ORAL_TABLET | Freq: Once | ORAL | 0 refills | Status: AC

## 2019-08-16 NOTE — ED Triage Notes (Signed)
Pt presents to [facility] for assessment of rectal itch x 2-3 months, states he scratches it sometimes so much it bleeds.  Denies pain.

## 2019-08-16 NOTE — Discharge Instructions (Addendum)
Take medication as prescribed. May repeat in 2 weeks. Follow with PCP for further evaluation management. May do sits baths for additional relief Return for worsening itching, pain, blood in stool, change in your bowel habit, abdominal pain, fever.

## 2019-08-16 NOTE — ED Notes (Signed)
Patient able to ambulate independently  

## 2019-08-16 NOTE — ED Provider Notes (Signed)
[name]    CSN: [id number] Arrival date & time: 08/16/19  1143      History   Chief Complaint Chief Complaint  Patient presents with  . rectal itchiness    HPI Thomas Merritt is a 24 y.o. male with history of asthma presenting for 2 to 60-month course of perianal itching.  States that sometimes he will scratch so much that it causes him to bleed a little bit.  Denies history of hemorrhoids, painful bowel movements, change in bowel movement, blood or melena in bowel movement.  Denies bulging or foreign body sensation.  No penetration or trauma to the area.  Patient does admit that sometimes he will wake up in the night when it "itches really bad".  Denies history of travel, uncooked food exposure, sick contacts.  Denies history constipation, diarrhea, abdominal pain, nausea, vomiting, fever.   Past Medical History:  Diagnosis Date  . Asthma     Patient Active Problem List   Diagnosis Date Noted  . Adjustment disorder with mixed anxiety and depressed mood 07/05/2019    History reviewed. No pertinent surgical history.    Home Medications    Prior to Admission medications   Medication Sig Start Date End Date Taking? Authorizing Provider  albendazole (ALBENZA) 200 MG tablet Take 2 tablets (400 mg total) by mouth once for 1 dose. Repeat same dose in 2 weeks 08/16/19 08/16/19  Hall-Potvin, [place], PA-C    Family History Family History  Problem Relation Age of Onset  . Diabetes Father   . Hypertension Father   . Renal Disease Father   . Asthma Father     Social History Social History   Tobacco Use  . Smoking status: Never Smoker  . Smokeless tobacco: Never Used  Substance Use Topics  . Alcohol use: Yes    Comment: rarely  . Drug use: Never     Allergies   Patient has no known allergies.   Review of Systems As per HPI   Physical Exam Triage Vital Signs ED Triage Vitals  Enc Vitals Group     BP      Pulse      Resp      Temp     Temp src      SpO2      Weight      Height      Head Circumference      Peak Flow      Pain Score      Pain Loc      Pain Edu?      Excl. in [place]?    No data found.  Updated Vital Signs BP 133/87 (BP Location: Left Arm)   Pulse 85   Temp 98.7 F (37.1 C) (Temporal)   Resp 16   SpO2 97%   Visual Acuity Right Eye Distance:   Left Eye Distance:   Bilateral Distance:    Right Eye Near:   Left Eye Near:    Bilateral Near:     Physical Exam Constitutional:      General: He is not in acute distress. HENT:     Head: Normocephalic and atraumatic.  Eyes:     General: No scleral icterus.    Pupils: Pupils are equal, round, and reactive to light.  Cardiovascular:     Rate and Rhythm: Normal rate.  Pulmonary:     Effort: Pulmonary effort is normal. No respiratory distress.     Breath sounds: No wheezing.  Genitourinary:  Comments: Small excoriation of gluteal cleft.  No perianal excoriations, lesions, fissures, erythema or tenderness.  No internal hemorrhoids appreciated.  Patient tolerates dairy well. Skin:    Coloration: Skin is not jaundiced or pale.  Neurological:     Mental Status: He is alert and oriented to person, place, and time.      UC Treatments / Results  Labs (all labs ordered are listed, but only abnormal results are displayed) Labs Reviewed - No data to display  EKG   Radiology No results found.  Procedures Procedures (including critical care time)  Medications Ordered in UC Medications - No data to display  Initial Impression / Assessment and Plan / UC Course  I have reviewed the triage vital signs and the nursing notes.  Pertinent labs & imaging results that were available during my care of the patient were reviewed by me and considered in my medical decision making (see chart for details).     Patient appears well.  Does seem to have some nocturnal pruritus: H&P suspicious for pinworms-we will cover with albendazole as outlined  below.  Low concern for tender hemorrhoids, perianal fissures at this time.  Return precautions discussed, patient verbalized understanding and is agreeable to plan. Final Clinical Impressions(s) / UC Diagnoses   Final diagnoses:  Anal pruritus     Discharge Instructions     Take medication as prescribed. May repeat in 2 weeks. Follow with PCP for further evaluation management. May do sits baths for additional relief Return for worsening itching, pain, blood in stool, change in your bowel habit, abdominal pain, fever.    ED Prescriptions    Medication Sig Dispense Auth. Provider   albendazole (ALBENZA) 200 MG tablet Take 2 tablets (400 mg total) by mouth once for 1 dose. Repeat same dose in 2 weeks 4 tablet Hall-Potvin, [place], PA-C     PDMP not reviewed this encounter.   Hall-Potvin, [place], [place] 08/16/19 1315

## 2020-02-16 ENCOUNTER — Ambulatory Visit (HOSPITAL_COMMUNITY)
Admission: EM | Admit: 2020-02-16 | Discharge: 2020-02-16 | Disposition: A | Discharge status: Home / Self Care | Payer: Medicaid Other | Attending: Emergency Medicine | Admitting: Emergency Medicine

## 2020-02-16 ENCOUNTER — Emergency Department (HOSPITAL_COMMUNITY): Payer: Medicaid Other

## 2020-02-16 ENCOUNTER — Emergency Department (HOSPITAL_COMMUNITY): Payer: Medicaid Other | Admitting: Certified Registered"

## 2020-02-16 ENCOUNTER — Other Ambulatory Visit: Payer: Self-pay

## 2020-02-16 ENCOUNTER — Encounter (HOSPITAL_COMMUNITY)
Admission: EM | Disposition: A | Discharge status: Home / Self Care | Payer: Self-pay | Source: Home / Self Care | Attending: Emergency Medicine

## 2020-02-16 ENCOUNTER — Encounter (HOSPITAL_COMMUNITY): Payer: Self-pay | Admitting: Emergency Medicine

## 2020-02-16 DIAGNOSIS — J45909 Unspecified asthma, uncomplicated: Secondary | ICD-10-CM | POA: Insufficient documentation

## 2020-02-16 DIAGNOSIS — K3580 Unspecified acute appendicitis: Secondary | ICD-10-CM | POA: Insufficient documentation

## 2020-02-16 DIAGNOSIS — Z20822 Contact with and (suspected) exposure to covid-19: Secondary | ICD-10-CM | POA: Insufficient documentation

## 2020-02-16 HISTORY — PX: LAPAROSCOPIC APPENDECTOMY: SHX408

## 2020-02-16 LAB — URINALYSIS, ROUTINE W REFLEX MICROSCOPIC
Bilirubin Urine: NEGATIVE
Glucose, UA: NEGATIVE mg/dL
Hgb urine dipstick: NEGATIVE
Ketones, ur: NEGATIVE mg/dL
Leukocytes,Ua: NEGATIVE
Nitrite: NEGATIVE
Protein, ur: NEGATIVE mg/dL
Specific Gravity, Urine: 1.025 (ref 1.005–1.030)
pH: 6 (ref 5.0–8.0)

## 2020-02-16 LAB — COMPREHENSIVE METABOLIC PANEL
ALT: 14 U/L (ref 0–44)
AST: 18 U/L (ref 15–41)
Albumin: 4.3 g/dL (ref 3.5–5.0)
Alkaline Phosphatase: 59 U/L (ref 38–126)
Anion gap: 11 (ref 5–15)
BUN: 10 mg/dL (ref 6–20)
CO2: 25 mmol/L (ref 22–32)
Calcium: 9.8 mg/dL (ref 8.9–10.3)
Chloride: 102 mmol/L (ref 98–111)
Creatinine, Ser: 1 mg/dL (ref 0.61–1.24)
GFR calc Af Amer: >60 mL/min (ref >60)
GFR calc non Af Amer: >60 mL/min (ref >60)
Glucose, Bld: 139 mg/dL — ABNORMAL HIGH (ref 70–99)
Potassium: 3.8 mmol/L (ref 3.5–5.1)
Sodium: 138 mmol/L (ref 135–145)
Total Bilirubin: 0.9 mg/dL (ref 0.3–1.2)
Total Protein: 7.6 g/dL (ref 6.5–8.1)

## 2020-02-16 LAB — CBC WITH DIFFERENTIAL/PLATELET
Abs Immature Granulocytes: 0.05 10*3/uL (ref 0.00–0.07)
Basophils Absolute: 0.1 10*3/uL (ref 0.0–0.1)
Basophils Relative: 0 %
Eosinophils Absolute: 0.1 10*3/uL (ref 0.0–0.5)
Eosinophils Relative: 1 %
HCT: 42.8 % (ref 39.0–52.0)
Hemoglobin: 14.3 g/dL (ref 13.0–17.0)
Immature Granulocytes: 0 %
Lymphocytes Relative: 15 %
Lymphs Abs: 2.2 10*3/uL (ref 0.7–4.0)
MCH: 32.2 pg (ref 26.0–34.0)
MCHC: 33.4 g/dL (ref 30.0–36.0)
MCV: 96.4 fL (ref 80.0–100.0)
Monocytes Absolute: 0.9 10*3/uL (ref 0.1–1.0)
Monocytes Relative: 6 %
Neutro Abs: 11.5 10*3/uL — ABNORMAL HIGH (ref 1.7–7.7)
Neutrophils Relative %: 78 %
Platelets: 274 10*3/uL (ref 150–400)
RBC: 4.44 MIL/uL (ref 4.22–5.81)
RDW: 11.4 % — ABNORMAL LOW (ref 11.5–15.5)
WBC: 14.8 10*3/uL — ABNORMAL HIGH (ref 4.0–10.5)
nRBC: 0 % (ref 0.0–0.2)

## 2020-02-16 LAB — SARS CORONAVIRUS 2 BY RT PCR (HOSPITAL ORDER, PERFORMED IN ~~LOC~~ HOSPITAL LAB): SARS Coronavirus 2: NEGATIVE

## 2020-02-16 LAB — LIPASE, BLOOD: Lipase: 21 U/L (ref 11–51)

## 2020-02-16 SURGERY — APPENDECTOMY, LAPAROSCOPIC
Anesthesia: General | Site: Abdomen

## 2020-02-16 MED ORDER — ROCURONIUM BROMIDE 10 MG/ML (PF) SYRINGE
PREFILLED_SYRINGE | INTRAVENOUS | Status: AC
  Filled 2020-02-16: qty 10

## 2020-02-16 MED ORDER — ONDANSETRON HCL 4 MG/2ML IJ SOLN
INTRAMUSCULAR | Status: AC
  Filled 2020-02-16: qty 2

## 2020-02-16 MED ORDER — LIDOCAINE HCL (CARDIAC) PF 100 MG/5ML IV SOSY
PREFILLED_SYRINGE | INTRAVENOUS | Status: DC | PRN
  Administered 2020-02-16: 100 mg via INTRAVENOUS

## 2020-02-16 MED ORDER — CHLORHEXIDINE GLUCONATE CLOTH 2 % EX PADS: 6.0000 | MEDICATED_PAD | Freq: Once | CUTANEOUS | Status: DC

## 2020-02-16 MED ORDER — PROPOFOL 10 MG/ML IV BOLUS
INTRAVENOUS | Status: DC | PRN
  Administered 2020-02-16: 120 mg via INTRAVENOUS

## 2020-02-16 MED ORDER — FENTANYL CITRATE (PF) 100 MCG/2ML IJ SOLN
100.0000 ug | Freq: Once | INTRAMUSCULAR | Status: AC
  Administered 2020-02-16: 100 ug via INTRAVENOUS
  Filled 2020-02-16: qty 2

## 2020-02-16 MED ORDER — MIDAZOLAM HCL 5 MG/5ML IJ SOLN
INTRAMUSCULAR | Status: DC | PRN
  Administered 2020-02-16: 2 mg via INTRAVENOUS

## 2020-02-16 MED ORDER — ACETAMINOPHEN 500 MG PO TABS
1000.0000 mg | ORAL_TABLET | Freq: Three times a day (TID) | ORAL | 0 refills | Status: AC
Start: 2020-02-16 — End: 2020-02-21

## 2020-02-16 MED ORDER — ROCURONIUM BROMIDE 100 MG/10ML IV SOLN
INTRAVENOUS | Status: DC | PRN
  Administered 2020-02-16: 40 mg via INTRAVENOUS

## 2020-02-16 MED ORDER — SUCCINYLCHOLINE CHLORIDE 20 MG/ML IJ SOLN
INTRAMUSCULAR | Status: DC | PRN
  Administered 2020-02-16: 140 mg via INTRAVENOUS

## 2020-02-16 MED ORDER — ONDANSETRON HCL 4 MG/2ML IJ SOLN: 4.0000 mg | Freq: Four times a day (QID) | INTRAMUSCULAR | Status: DC | PRN

## 2020-02-16 MED ORDER — METRONIDAZOLE IN NACL 5-0.79 MG/ML-% IV SOLN
500.0000 mg | Freq: Once | INTRAVENOUS | Status: AC
  Administered 2020-02-16: 500 mg via INTRAVENOUS
  Filled 2020-02-16: qty 100

## 2020-02-16 MED ORDER — LACTATED RINGERS IV SOLN: INTRAVENOUS | Status: DC

## 2020-02-16 MED ORDER — HYDROMORPHONE HCL 1 MG/ML IJ SOLN: 0.2500 mg | INTRAMUSCULAR | Status: DC | PRN

## 2020-02-16 MED ORDER — ONDANSETRON HCL 4 MG/2ML IJ SOLN
INTRAMUSCULAR | Status: DC | PRN
  Administered 2020-02-16: 4 mg via INTRAVENOUS

## 2020-02-16 MED ORDER — IBUPROFEN 800 MG PO TABS
800.0000 mg | ORAL_TABLET | Freq: Three times a day (TID) | ORAL | 0 refills | Status: AC | PRN
Start: 2020-02-16 — End: 2020-02-21

## 2020-02-16 MED ORDER — IOHEXOL 300 MG/ML  SOLN
100.0000 mL | Freq: Once | INTRAMUSCULAR | Status: AC | PRN
  Administered 2020-02-16: 100 mL via INTRAVENOUS

## 2020-02-16 MED ORDER — ACETAMINOPHEN 500 MG PO TABS
1000.0000 mg | ORAL_TABLET | ORAL | Status: AC
  Administered 2020-02-16: 1000 mg via ORAL
  Filled 2020-02-16: qty 2

## 2020-02-16 MED ORDER — HYDROMORPHONE HCL 1 MG/ML IJ SOLN: 0.5000 mg | INTRAMUSCULAR | Status: DC | PRN

## 2020-02-16 MED ORDER — KETOROLAC TROMETHAMINE 30 MG/ML IJ SOLN
INTRAMUSCULAR | Status: DC | PRN
Start: 2020-02-16 — End: 2020-02-16
  Administered 2020-02-16: 30 mg via INTRAVENOUS

## 2020-02-16 MED ORDER — DEXAMETHASONE SODIUM PHOSPHATE 10 MG/ML IJ SOLN
INTRAMUSCULAR | Status: AC
  Filled 2020-02-16: qty 1

## 2020-02-16 MED ORDER — KETOROLAC TROMETHAMINE 30 MG/ML IJ SOLN
INTRAMUSCULAR | Status: AC
  Filled 2020-02-16: qty 1

## 2020-02-16 MED ORDER — CHLORHEXIDINE GLUCONATE 0.12 % MT SOLN
OROMUCOSAL | Status: AC
  Administered 2020-02-16: 15 mL via OROMUCOSAL
  Filled 2020-02-16: qty 15

## 2020-02-16 MED ORDER — ALBUTEROL SULFATE HFA 108 (90 BASE) MCG/ACT IN AERS
2.0000 | INHALATION_SPRAY | RESPIRATORY_TRACT | Status: DC
  Administered 2020-02-16 (×2): 2 via RESPIRATORY_TRACT
  Filled 2020-02-16: qty 6.7

## 2020-02-16 MED ORDER — PROPOFOL 10 MG/ML IV BOLUS
INTRAVENOUS | Status: AC
  Filled 2020-02-16: qty 20

## 2020-02-16 MED ORDER — DEXAMETHASONE SODIUM PHOSPHATE 4 MG/ML IJ SOLN
INTRAMUSCULAR | Status: DC | PRN
  Administered 2020-02-16: 5 mg via INTRAVENOUS

## 2020-02-16 MED ORDER — BUPIVACAINE-EPINEPHRINE 0.25% -1:200000 IJ SOLN
INTRAMUSCULAR | Status: DC | PRN
  Administered 2020-02-16: 26 mL

## 2020-02-16 MED ORDER — MEPERIDINE HCL 25 MG/ML IJ SOLN: 6.2500 mg | INTRAMUSCULAR | Status: DC | PRN

## 2020-02-16 MED ORDER — HALOPERIDOL LACTATE 5 MG/ML IJ SOLN
2.0000 mg | Freq: Once | INTRAMUSCULAR | Status: AC
  Administered 2020-02-16: 2 mg via INTRAVENOUS
  Filled 2020-02-16: qty 1

## 2020-02-16 MED ORDER — SODIUM CHLORIDE 0.9 % IV SOLN
2.0000 g | Freq: Once | INTRAVENOUS | Status: AC
  Administered 2020-02-16: 2 g via INTRAVENOUS
  Filled 2020-02-16: qty 20

## 2020-02-16 MED ORDER — FENTANYL CITRATE (PF) 100 MCG/2ML IJ SOLN
INTRAMUSCULAR | Status: DC | PRN
  Administered 2020-02-16 (×3): 50 ug via INTRAVENOUS

## 2020-02-16 MED ORDER — SODIUM CHLORIDE 0.45 % IV SOLN: INTRAVENOUS | Status: DC

## 2020-02-16 MED ORDER — CELECOXIB 400 MG PO CAPS
400.0000 mg | ORAL_CAPSULE | ORAL | Status: AC
  Administered 2020-02-16: 400 mg via ORAL
  Filled 2020-02-16: qty 1
  Filled 2020-02-16: qty 2

## 2020-02-16 MED ORDER — ONDANSETRON HCL 4 MG/2ML IJ SOLN: 4.0000 mg | Freq: Once | INTRAMUSCULAR | Status: DC | PRN

## 2020-02-16 MED ORDER — OXYCODONE HCL 5 MG PO TABS
5.0000 mg | ORAL_TABLET | Freq: Four times a day (QID) | ORAL | 0 refills | Status: DC | PRN
Start: 2020-02-16 — End: 2020-03-03

## 2020-02-16 MED ORDER — SODIUM CHLORIDE 0.9 % IR SOLN
Status: DC | PRN
  Administered 2020-02-16: 1000 mL

## 2020-02-16 MED ORDER — SUGAMMADEX SODIUM 200 MG/2ML IV SOLN
INTRAVENOUS | Status: DC | PRN
  Administered 2020-02-16: 200 mg via INTRAVENOUS

## 2020-02-16 MED ORDER — MIDAZOLAM HCL 2 MG/2ML IJ SOLN
INTRAMUSCULAR | Status: AC
  Filled 2020-02-16: qty 2

## 2020-02-16 MED ORDER — CHLORHEXIDINE GLUCONATE 0.12 % MT SOLN: 15.0000 mL | Freq: Once | OROMUCOSAL | Status: AC

## 2020-02-16 MED ORDER — SUCCINYLCHOLINE CHLORIDE 200 MG/10ML IV SOSY
PREFILLED_SYRINGE | INTRAVENOUS | Status: AC
  Filled 2020-02-16: qty 10

## 2020-02-16 MED ORDER — GABAPENTIN 300 MG PO CAPS
300.0000 mg | ORAL_CAPSULE | ORAL | Status: AC
  Administered 2020-02-16: 300 mg via ORAL
  Filled 2020-02-16: qty 1

## 2020-02-16 MED ORDER — BUPIVACAINE HCL (PF) 0.25 % IJ SOLN
INTRAMUSCULAR | Status: AC
  Filled 2020-02-16: qty 30

## 2020-02-16 MED ORDER — LACTATED RINGERS IV BOLUS
1000.0000 mL | Freq: Once | INTRAVENOUS | Status: AC
  Administered 2020-02-16: 1000 mL via INTRAVENOUS

## 2020-02-16 MED ORDER — ORAL CARE MOUTH RINSE: 15.0000 mL | Freq: Once | OROMUCOSAL | Status: AC

## 2020-02-16 MED ORDER — BUPIVACAINE LIPOSOME 1.3 % IJ SUSP
20.0000 mL | Freq: Once | INTRAMUSCULAR | Status: DC
  Filled 2020-02-16: qty 20

## 2020-02-16 MED ORDER — FENTANYL CITRATE (PF) 250 MCG/5ML IJ SOLN
INTRAMUSCULAR | Status: AC
  Filled 2020-02-16: qty 5

## 2020-02-16 MED ORDER — 0.9 % SODIUM CHLORIDE (POUR BTL) OPTIME
TOPICAL | Status: DC | PRN
  Administered 2020-02-16: 1000 mL

## 2020-02-16 MED ORDER — LIDOCAINE 2% (20 MG/ML) 5 ML SYRINGE
INTRAMUSCULAR | Status: AC
  Filled 2020-02-16: qty 5

## 2020-02-16 SURGICAL SUPPLY — 43 items
BENZOIN TINCTURE PRP APPL 2/3 (GAUZE/BANDAGES/DRESSINGS) ×3 IMPLANT
BLADE CLIPPER SURG (BLADE) ×3 IMPLANT
BNDG ADH 1X3 SHEER STRL LF (GAUZE/BANDAGES/DRESSINGS) IMPLANT
CHLORAPREP W/TINT 26 (MISCELLANEOUS) ×3 IMPLANT
CLOSURE STERI-STRIP 1/2X4 (GAUZE/BANDAGES/DRESSINGS) ×1
CLSR STERI-STRIP ANTIMIC 1/2X4 (GAUZE/BANDAGES/DRESSINGS) ×2 IMPLANT
COVER SURGICAL LIGHT HANDLE (MISCELLANEOUS) ×3 IMPLANT
COVER WAND RF STERILE (DRAPES) ×3 IMPLANT
CUTTER FLEX LINEAR 45M (STAPLE) ×3 IMPLANT
DEVICE TROCAR PUNCTURE CLOSURE (ENDOMECHANICALS) ×3 IMPLANT
DRSG TEGADERM 2-3/8X2-3/4 SM (GAUZE/BANDAGES/DRESSINGS) ×6 IMPLANT
DRSG TEGADERM 4X4.75 (GAUZE/BANDAGES/DRESSINGS) ×3 IMPLANT
ELECT REM PT RETURN 9FT ADLT (ELECTROSURGICAL) ×3
ELECTRODE REM PT RTRN 9FT ADLT (ELECTROSURGICAL) ×1 IMPLANT
GAUZE SPONGE 2X2 8PLY STRL LF (GAUZE/BANDAGES/DRESSINGS) IMPLANT
GAUZE SPONGE 4X4 12PLY STRL (GAUZE/BANDAGES/DRESSINGS) ×3 IMPLANT
GLOVE BIOGEL M STRL SZ7.5 (GLOVE) ×12 IMPLANT
GLOVE INDICATOR 8.0 STRL GRN (GLOVE) ×9 IMPLANT
GOWN STRL REUS W/ TWL LRG LVL3 (GOWN DISPOSABLE) ×2 IMPLANT
GOWN STRL REUS W/TWL 2XL LVL3 (GOWN DISPOSABLE) ×3 IMPLANT
GOWN STRL REUS W/TWL LRG LVL3 (GOWN DISPOSABLE) ×4
KIT BASIN OR (CUSTOM PROCEDURE TRAY) ×3 IMPLANT
KIT TURNOVER KIT B (KITS) ×3 IMPLANT
NS IRRIG 1000ML POUR BTL (IV SOLUTION) ×3 IMPLANT
PAD ARMBOARD 7.5X6 YLW CONV (MISCELLANEOUS) ×6 IMPLANT
POUCH RETRIEVAL ECOSAC 10 (ENDOMECHANICALS) ×1 IMPLANT
POUCH RETRIEVAL ECOSAC 10MM (ENDOMECHANICALS) ×2
RELOAD 45 VASCULAR/THIN (ENDOMECHANICALS) ×3 IMPLANT
SCISSORS LAP 5X35 DISP (ENDOMECHANICALS) ×3 IMPLANT
SET IRRIG TUBING LAPAROSCOPIC (IRRIGATION / IRRIGATOR) ×3 IMPLANT
SET TUBE SMOKE EVAC HIGH FLOW (TUBING) ×3 IMPLANT
SHEARS HARMONIC ACE PLUS 36CM (ENDOMECHANICALS) ×3 IMPLANT
SLEEVE ENDOPATH XCEL 5M (ENDOMECHANICALS) ×3 IMPLANT
SPECIMEN JAR SMALL (MISCELLANEOUS) ×3 IMPLANT
SPONGE GAUZE 2X2 STER 10/PKG (GAUZE/BANDAGES/DRESSINGS)
SUT MNCRL AB 4-0 PS2 18 (SUTURE) ×3 IMPLANT
SUT VICRYL 0 UR6 27IN ABS (SUTURE) ×6 IMPLANT
TOWEL GREEN STERILE (TOWEL DISPOSABLE) ×3 IMPLANT
TOWEL GREEN STERILE FF (TOWEL DISPOSABLE) ×3 IMPLANT
TRAY LAPAROSCOPIC MC (CUSTOM PROCEDURE TRAY) ×3 IMPLANT
TROCAR XCEL BLUNT TIP 100MML (ENDOMECHANICALS) ×3 IMPLANT
TROCAR XCEL NON-BLD 5MMX100MML (ENDOMECHANICALS) ×3 IMPLANT
WATER STERILE IRR 1000ML POUR (IV SOLUTION) ×3 IMPLANT

## 2020-02-16 NOTE — Anesthesia Postprocedure Evaluation (Signed)
Anesthesia Post Note  Patient: Thomas Merritt  Procedure(s) Performed: APPENDECTOMY LAPAROSCOPIC (N/A Abdomen)     Patient location during evaluation: PACU Anesthesia Type: General Level of consciousness: awake and alert Pain management: pain level controlled Vital Signs Assessment: post-procedure vital signs reviewed and stable Respiratory status: spontaneous breathing, nonlabored ventilation, respiratory function stable and patient connected to nasal cannula oxygen Cardiovascular status: blood pressure returned to baseline and stable Postop Assessment: no apparent nausea or vomiting Anesthetic complications: no   No complications documented.  Last Vitals:  Vitals:   02/16/20 1339 02/16/20 1354  BP: 137/70 131/78  Pulse: 77 74  Resp: 14 15  Temp:  36.7 C  SpO2: 96% 95%    Last Pain:  Vitals:   02/16/20 1354  TempSrc:   PainSc: 0-No pain                 Ayden Hardwick DAVID

## 2020-02-16 NOTE — Op Note (Addendum)
Thomas Merritt [id number] 06/21/1996 02/16/2020  Appendectomy, Lap, Procedure Note  Indications: The patient presented with a history of right-sided abdominal pain. A CT revealed findings consistent with acute appendicitis.  Pre-operative Diagnosis: Acute appendicitis without mention of peritonitis  Post-operative Diagnosis: Same  Surgeon: Gaynelle Adu MD FACS  Assistants: none  Anesthesia: General endotracheal anesthesia  Procedure Details  The patient was seen again in the Holding Room. The risks, benefits, complications, treatment options, and expected outcomes were discussed with the patient and/or family. The possibilities of perforation of viscus, bleeding, recurrent infection, the need for additional procedures, failure to diagnose a condition, and creating a complication requiring transfusion or operation were discussed. There was concurrence with the proposed plan and informed consent was obtained. The site of surgery was properly noted. The patient was taken to Operating Room, identified as [facility] and the procedure verified as Appendectomy. A Time Out was held and the above information confirmed.  The patient was placed in the supine position and general anesthesia was induced, along with placement of orogastric tube, SCDs, and a Foley catheter. The abdomen was prepped and draped in a sterile fashion. A 1.5 centimeter infraumbilical incision was made.  The umbilical stalk was elevated, and the midline fascia was incised with a #11 blade.  A Kelly clamp was used to confirm entrance into the peritoneal cavity.  A pursestring suture was passed around the incision with a 0 Vicryl.  A 61mm Hasson was introduced into the abdomen and the tails of the suture were used to hold the Hasson in place.   The pneumoperitoneum was then established to steady pressure of 15 mmHg.  Additional 5 mm cannulas then placed in the left lower quadrant of the abdomen and the suprapubic region  under direct visualization. A careful evaluation of the entire abdomen was carried out. The patient was placed in Trendelenburg and left lateral decubitus position. The small intestines were retracted in the cephalad and left lateral direction away from the pelvis and right lower quadrant. The patient was found to have an inflammed appendix that was extending into the pelvis. There was no evidence of perforation. There was a little bit of purulent fluid in the pelvis.   The appendix was carefully dissected. The appendix was was skeletonized with the harmonic scalpel.   The appendix was divided at its base using an endo-GIA stapler with a white load. No appendiceal stump was left in place. The appendix was removed from the abdomen with an Ecco bag through the umbilical port.  There was no evidence of bleeding, leakage, or complication after division of the appendix. Irrigation was also performed and irrigate suctioned from the abdomen as well.  The umbilical port site was closed with the purse string suture. The closure was viewed laparoscopically.  I did place an additional interrupted 0 Vicryl at the umbilical fascia using the Endo Close device.  Local was infiltrated around the umbilicus and along the right lateral abdominal wall as a tap block.  There was no residual palpable fascial defect.  The trocar site skin wounds were closed with 4-0 Monocryl.  Steri-Strips and sterile bandages were applied to the skin incisions.  Instrument, sponge, and needle counts were correct at the conclusion of the case.   Findings: The appendix was found to be inflamed. There were not signs of necrosis.  There was not perforation. There was not abscess formation.  Estimated Blood Loss:  Minimal         Drains: none  Specimens: appendix          Complications:  None; patient tolerated the procedure well.         Disposition: PACU - hemodynamically stable.         Condition: stable; updated acquaintance  about surgical findings as well as discussed discharge instructions over the phone  Mary Sella. Andrey Campanile, MD, FACS General, Bariatric, & Minimally Invasive Surgery [facility] Surgery, [place]

## 2020-02-16 NOTE — H&P (Signed)
[facility] Admission Note  Thomas Merritt Jan 28, 1996  [id number].    Requesting MD: Meridee Score Chief Complaint/Reason for Consult: appendicitis  HPI:  Thomas Merritt is a 24yo male who presented to [facility] earlier this morning complaining of abdominal pain. States that the pain started around noon yesterday. It is in his lower abdomen. Constant and gradually getting worse. Associated with nausea and vomiting. Denies fever, chills, diarrhea, dysuria. States that he has never had pain like this before. In the ED he underwent CT scan which shows Mild acute appendicitis without evidence of perforation; suspected 3 mm proximal appendicolith. WBC 14.5. Patient was started on rocephin/flagyl. General surgery asked to see.  He has had nothing to eat/drink today.  Abdominal surgical history: none Anticoagulants: none Nonsmoker Employment: maintenance for apartment complex  Review of Systems  Constitutional: Negative.   HENT: Negative.   Respiratory: Negative.   Cardiovascular: Negative.   Gastrointestinal: Positive for abdominal pain, nausea and vomiting. Negative for diarrhea.  Genitourinary: Negative.     All systems reviewed and otherwise negative except for as above  Family History  Problem Relation Age of Onset  . Diabetes Father   . Hypertension Father   . Renal Disease Father   . Asthma Father     Past Medical History:  Diagnosis Date  . Asthma     History reviewed. No pertinent surgical history.  Social History:  reports that he has never smoked. He has never used smokeless tobacco. He reports current alcohol use. He reports that he does not use drugs.  Allergies: No Known Allergies  (Not in a hospital admission)   Prior to Admission medications   Not on File    Blood pressure (!) 146/74, pulse 79, temperature 98.6 F (37 C), temperature source Oral, resp. rate 18, height 5' 9.75" (1.772 m), weight 81.6 kg, SpO2 100 %. Physical  Exam: General: pleasant, WD/WN black male who is laying in bed in NAD HEENT: head is normocephalic, atraumatic.  Sclera are noninjected.  PERRL.  Ears and nose without any masses or lesions.  Mouth is pink and moist. Dentition fair Heart: regular, rate, and rhythm.  Normal s1,s2. No obvious murmurs, gallops, or rubs noted.  Palpable pedal pulses bilaterally  Lungs: CTAB, no wheezes, rhonchi, or rales noted.  Respiratory effort nonlabored Abd: soft, ND, nontender, +BS, no masses, hernias, or organomegaly MS: no BUE/BLE edema, calves soft and nontender Skin: warm and dry with no masses, lesions, or rashes Psych: A&Ox4 with an appropriate affect Neuro: cranial nerves grossly intact, equal strength in BUE/BLE bilaterally, normal speech, thought process intact  Results for orders placed or performed during the hospital encounter of 02/16/20 (from the past 48 hour(s))  Urinalysis, Routine w reflex microscopic Urine, Clean Catch     Status: None   Collection Time: 02/16/20  4:33 AM  Result Value Ref Range   Color, Urine YELLOW YELLOW   APPearance CLEAR CLEAR   Specific Gravity, Urine 1.025 1.005 - 1.030   pH 6.0 5.0 - 8.0   Glucose, UA NEGATIVE NEGATIVE mg/dL   Hgb urine dipstick NEGATIVE NEGATIVE   Bilirubin Urine NEGATIVE NEGATIVE   Ketones, ur NEGATIVE NEGATIVE mg/dL   Protein, ur NEGATIVE NEGATIVE mg/dL   Nitrite NEGATIVE NEGATIVE   Leukocytes,Ua NEGATIVE NEGATIVE    Comment: Performed at [facility], [address], [place], [place] [address]  Comprehensive metabolic panel     Status: Abnormal   Collection Time: 02/16/20  4:36 AM  Result  Value Ref Range   Sodium 138 135 - 145 mmol/L   Potassium 3.8 3.5 - 5.1 mmol/L   Chloride 102 98 - 111 mmol/L   CO2 25 22 - 32 mmol/L   Glucose, Bld 139 (H) 70 - 99 mg/dL    Comment: Glucose reference range applies only to samples taken after fasting for at least 8 hours.   BUN 10 6 - 20 mg/dL   Creatinine, Ser 4.31 0.61 - 1.24 mg/dL    Calcium 9.8 8.9 - 54.0 mg/dL   Total Protein 7.6 6.5 - 8.1 g/dL   Albumin 4.3 3.5 - 5.0 g/dL   AST 18 15 - 41 U/L   ALT 14 0 - 44 U/L   Alkaline Phosphatase 59 38 - 126 U/L   Total Bilirubin 0.9 0.3 - 1.2 mg/dL   GFR calc non Af Amer >60 >60 mL/min   GFR calc Af Amer >60 >60 mL/min   Anion gap 11 5 - 15    Comment: Performed at [facility], [address] [place], [place] [address]  Lipase, blood     Status: None   Collection Time: 02/16/20  4:36 AM  Result Value Ref Range   Lipase 21 11 - 51 U/L    Comment: Performed at [facility], [address], [place], [place] [address]  CBC with Diff     Status: Abnormal   Collection Time: 02/16/20  4:36 AM  Result Value Ref Range   WBC 14.8 (H) 4.0 - 10.5 K/uL   RBC 4.44 4.22 - 5.81 MIL/uL   Hemoglobin 14.3 13.0 - 17.0 g/dL   HCT 32.6 39 - 52 %   MCV 96.4 80.0 - 100.0 fL   MCH 32.2 26.0 - 34.0 pg   MCHC 33.4 30.0 - 36.0 g/dL   RDW 71.2 (L) 45.8 - 09.9 %   Platelets 274 150 - 400 K/uL   nRBC 0.0 0.0 - 0.2 %   Neutrophils Relative % 78 %   Neutro Abs 11.5 (H) 1.7 - 7.7 K/uL   Lymphocytes Relative 15 %   Lymphs Abs 2.2 0.7 - 4.0 K/uL   Monocytes Relative 6 %   Monocytes Absolute 0.9 0 - 1 K/uL   Eosinophils Relative 1 %   Eosinophils Absolute 0.1 0 - 0 K/uL   Basophils Relative 0 %   Basophils Absolute 0.1 0 - 0 K/uL   Immature Granulocytes 0 %   Abs Immature Granulocytes 0.05 0.00 - 0.07 K/uL    Comment: Performed at [facility], [address], [place], [place] [address]   CT ABDOMEN PELVIS W CONTRAST  Result Date: 02/16/2020 CLINICAL DATA:  Lower abdominal pain, nausea/vomiting EXAM: CT ABDOMEN AND PELVIS WITH CONTRAST TECHNIQUE: Multidetector CT imaging of the abdomen and pelvis was performed using the standard protocol following bolus administration of intravenous contrast. CONTRAST:  OMNIPAQUE IOHEXOL 300 MG/ML  SOLN COMPARISON:  None. FINDINGS: Lower chest: Lung bases are clear. Hepatobiliary: Liver  is within normal limits. Gallbladder is unremarkable. No intrahepatic or extrahepatic ductal dilatation. Pancreas: Within normal limits. Spleen: Normal limits. Adrenals/Urinary Tract: Adrenal glands are within normal limits. Kidneys are within normal limits.  No hydronephrosis. Bladder is within normal limits. Stomach/Bowel: Stomach is within normal limits. No evidence of bowel obstruction. Abnormal appendix, with 3 mm proximal appendicolith (series 3/image 58), and dilated proximal/mid appendix measuring up to 10 mm (series 3/image 65), with mucosal hyperenhancement and possible mild periappendiceal inflammatory changes (series 3/image  70). This appearance suggests mild acute appendicitis. No drainable fluid collection/abscess. No free air to suggest macroscopic perforation. Vascular/Lymphatic: No evidence of abdominal aortic aneurysm. No suspicious abdominopelvic lymphadenopathy. Reproductive: Prostate is unremarkable. Other: No abdominopelvic ascites. Musculoskeletal: Visualized osseous structures are within normal limits. IMPRESSION: Mild acute appendicitis, without evidence of perforation. Suspected 3 mm proximal appendicolith. Electronically Signed   By: Charline Bills M.D.   On: 02/16/2020 08:26      Assessment/Plan  Acute appendicitis - Patient with clinical and radiographic findings consistent with acute appendicitis. Recommend laparoscopic appendectomy today. Rocephin/flagyl has already been started. Keep NPO. Covid test is pending. He may be able to go home later today.   Franne Forts, PA-C [facility] Surgery 02/16/2020, 10:26 AM Please see Amion for pager number during day hours 7:00am-4:30pm

## 2020-02-16 NOTE — Transfer of Care (Signed)
Immediate Anesthesia Transfer of Care Note  Patient: Thomas Merritt  Procedure(s) Performed: APPENDECTOMY LAPAROSCOPIC (N/A )  Patient Location: PACU  Anesthesia Type:General  Level of Consciousness: oriented and drowsy  Airway & Oxygen Therapy: Patient Spontanous Breathing  Post-op Assessment: Report given to RN  Post vital signs: Reviewed and stable  Last Vitals:  Vitals Value Taken Time  BP 138/65 02/16/20 1323  Temp    Pulse 87 02/16/20 1324  Resp 17 02/16/20 1324  SpO2 98 % 02/16/20 1324  Vitals shown include unvalidated device data.  Last Pain:  Vitals:   02/16/20 1152  TempSrc: Tympanic  PainSc:          Complications: No complications documented.

## 2020-02-16 NOTE — Discharge Instructions (Signed)
[facility] LAPAROSCOPIC SURGERY: POST OP INSTRUCTIONS Always review your discharge instruction sheet given to you by the facility where your surgery was performed. IF YOU HAVE DISABILITY OR FAMILY LEAVE FORMS, YOU MUST BRING THEM TO THE OFFICE FOR PROCESSING.   DO NOT GIVE THEM TO YOUR DOCTOR.  PAIN CONTROL  1. First take acetaminophen (Tylenol) AND/or ibuprofen (Advil) to control your pain after surgery.  Follow directions on package.  Taking acetaminophen (Tylenol) and/or ibuprofen (Advil) regularly after surgery will help to control your pain and lower the amount of prescription pain medication you may need.  You should not take more than 3,000 mg (3 grams) of acetaminophen (Tylenol) in 24 hours.  You should not take ibuprofen (Advil), aleve, motrin, naprosyn or other NSAIDS if you have a history of stomach ulcers or chronic kidney disease.  2. A prescription for pain medication may be given to you upon discharge.  Take your pain medication as prescribed, if you still have uncontrolled pain after taking acetaminophen (Tylenol) or ibuprofen (Advil). 3. Use ice packs to help control pain. 4. If you need a refill on your pain medication, please contact your pharmacy.  They will contact our office to request authorization. Prescriptions will not be filled after 5pm or on week-ends.  HOME MEDICATIONS 5. Take your usually prescribed medications unless otherwise directed.  DIET 6. You should follow a light diet the first few days after arrival home.  Be sure to include lots of fluids daily. Avoid fatty, fried foods.   CONSTIPATION 7. It is common to experience some constipation after surgery and if you are taking pain medication.  Increasing fluid intake and taking a stool softener (such as Colace) will usually help or prevent this problem from occurring.  A mild laxative (Milk of Magnesia or Miralax) should be taken according to package instructions if there are no bowel  movements after 48 hours.  WOUND/INCISION CARE 8. Most patients will experience some swelling and bruising in the area of the incisions.  Ice packs will help.  Swelling and bruising can take several days to resolve.  9. Unless discharge instructions indicate otherwise, follow guidelines below  a. STERI-STRIPS - you may remove your outer bandages 48 hours after surgery, and you may shower at that time.  You have steri-strips (small skin tapes) in place directly over the incision.  These strips should be left on the skin for 7-10 days.   b. DERMABOND/SKIN GLUE - you may shower in 24 hours.  The glue will flake off over the next 2-3 weeks. 10. Any sutures or staples will be removed at the office during your follow-up visit.  ACTIVITIES 11. You may resume regular (light) daily activities beginning the next day--such as daily self-care, walking, climbing stairs--gradually increasing activities as tolerated.  You may have sexual intercourse when it is comfortable.  Refrain from any heavy lifting or straining until approved by your doctor. a. You may drive when you are no longer taking prescription pain medication, you can comfortably wear a seatbelt, and you can safely maneuver your car and apply brakes.  FOLLOW-UP 12. You should see your doctor in the office for a follow-up appointment approximately 2-3 weeks after your surgery.  You should have been given your post-op/follow-up appointment when your surgery was scheduled.  If you did not receive a post-op/follow-up appointment, make sure that you call for this appointment within a day or two after you arrive home to insure a convenient appointment time.  OTHER   INSTRUCTIONS 13. Do not lift, push, or pull anything greater than 10 pounds for 2-week  WHEN TO CALL YOUR DOCTOR: 1. Fever over 101.0 2. Inability to urinate 3. Continued bleeding from incision. 4. Increased pain, redness, or drainage from the incision. 5. Increasing abdominal pain  The  clinic staff is available to answer your questions during regular business hours.  Please don't hesitate to call and ask to speak to one of the nurses for clinical concerns.  If you have a medical emergency, go to the nearest emergency room or call 911.  A surgeon from [facility] is always on call at the hospital. [address], [place], [place]  [address] ? [address] [address], [place], [place]   [address] [phone] ? [phone] ? FAX [phone] Web site: [email or web address]  ........Marland Kitchen   Managing Your Pain After Surgery Without Opioids    Thank you for participating in our program to help patients manage their pain after surgery without opioids. This is part of our effort to provide you with the best care possible, without exposing you or your family to the risk that opioids pose.  What pain can I expect after surgery? You can expect to have some pain after surgery. This is normal. The pain is typically worse the day after surgery, and quickly begins to get better. Many studies have found that many patients are able to manage their pain after surgery with Over-the-Counter (OTC) medications such as Tylenol and Motrin. If you have a condition that does not allow you to take Tylenol or Motrin, notify your surgical team.  How will I manage my pain? The best strategy for controlling your pain after surgery is around the clock pain control with Tylenol (acetaminophen) and Motrin (ibuprofen or Advil). Alternating these medications with each other allows you to maximize your pain control. In addition to Tylenol and Motrin, you can use heating pads or ice packs on your incisions to help reduce your pain.  How will I alternate your regular strength over-the-counter pain medication? You will take a dose of pain medication every three hours. ; Start by taking 650 mg of Tylenol (2 pills of 325 mg) ; 3 hours later take 600 mg of Motrin (3 pills of 200 mg) ; 3  hours after taking the Motrin take 650 mg of Tylenol ; 3 hours after that take 600 mg of Motrin.   - 1 -  See example - if your first dose of Tylenol is at 12:00 PM   12:00 PM Tylenol 650 mg (2 pills of 325 mg)  3:00 PM Motrin 600 mg (3 pills of 200 mg)  6:00 PM Tylenol 650 mg (2 pills of 325 mg)  9:00 PM Motrin 600 mg (3 pills of 200 mg)  Continue alternating every 3 hours   We recommend that you follow this schedule around-the-clock for at least 3 days after surgery, or until you feel that it is no longer needed. Use the table on the last page of this handout to keep track of the medications you are taking. Important: Do not take more than 3000mg  of Tylenol or 1800mg  of Motrin in a 24-hour period. Do not take ibuprofen/Motrin if you have a history of bleeding stomach ulcers, severe kidney disease, &/or actively taking a blood thinner  What if I still have pain? If you have pain that is not controlled with the over-the-counter pain medications (Tylenol and Motrin or Advil) you might have what we call "breakthrough" pain.  You will receive a prescription for a small amount of an opioid pain medication such as Oxycodone, Tramadol, or Tylenol with Codeine. Use these opioid pills in the first 24 hours after surgery if you have breakthrough pain. Do not take more than 1 pill every 4-6 hours.  If you still have uncontrolled pain after using all opioid pills, don't hesitate to call our staff using the number provided. We will help make sure you are managing your pain in the best way possible, and if necessary, we can provide a prescription for additional pain medication.   Day 1    Time  Name of Medication Number of pills taken  Amount of Acetaminophen  Pain Level   Comments  AM PM       AM PM       AM PM       AM PM       AM PM       AM PM       AM PM       AM PM       Total Daily amount of Acetaminophen Do not take more than  3,000 mg per day      Day 2    Time  Name of  Medication Number of pills taken  Amount of Acetaminophen  Pain Level   Comments  AM PM       AM PM       AM PM       AM PM       AM PM       AM PM       AM PM       AM PM       Total Daily amount of Acetaminophen Do not take more than  3,000 mg per day      Day 3    Time  Name of Medication Number of pills taken  Amount of Acetaminophen  Pain Level   Comments  AM PM       AM PM       AM PM       AM PM          AM PM       AM PM       AM PM       AM PM       Total Daily amount of Acetaminophen Do not take more than  3,000 mg per day      Day 4    Time  Name of Medication Number of pills taken  Amount of Acetaminophen  Pain Level   Comments  AM PM       AM PM       AM PM       AM PM       AM PM       AM PM       AM PM       AM PM       Total Daily amount of Acetaminophen Do not take more than  3,000 mg per day      Day 5    Time  Name of Medication Number of pills taken  Amount of Acetaminophen  Pain Level   Comments  AM PM       AM PM       AM PM       AM PM       AM PM  AM PM       AM PM       AM PM       Total Daily amount of Acetaminophen Do not take more than  3,000 mg per day       Day 6    Time  Name of Medication Number of pills taken  Amount of Acetaminophen  Pain Level  Comments  AM PM       AM PM       AM PM       AM PM       AM PM       AM PM       AM PM       AM PM       Total Daily amount of Acetaminophen Do not take more than  3,000 mg per day      Day 7    Time  Name of Medication Number of pills taken  Amount of Acetaminophen  Pain Level   Comments  AM PM       AM PM       AM PM       AM PM       AM PM       AM PM       AM PM       AM PM       Total Daily amount of Acetaminophen Do not take more than  3,000 mg per day        For additional information about how and where to safely dispose of unused opioid medications - [employer]  Disclaimer: This  document contains information and/or instructional materials adapted from [place] for the typical patient with your condition. It does not replace medical advice from your health care provider because your experience may differ from that of the typical patient. Talk to your health care provider if you have any questions about this document, your condition or your treatment plan. Adapted from [place]

## 2020-02-16 NOTE — ED Provider Notes (Signed)
[facility] EMERGENCY DEPARTMENT Provider Note   CSN: [id number] Arrival date & time: 02/16/20  0413     History No chief complaint on file.   Thomas Merritt is a 24 y.o. male.  The history is provided by the patient.  Abdominal Pain Pain location:  LLQ and LUQ Pain severity:  Moderate Onset quality:  Gradual Timing:  Intermittent Progression:  Worsening Chronicity:  New Relieved by:  Nothing Worsened by:  Movement and palpation Associated symptoms: nausea and vomiting   Associated symptoms: no chest pain, no dysuria, no fever and no shortness of breath   Patient with history of asthma presents with abdominal pain.  Over the past day has had increasing lower abdominal pain along with nausea vomiting.  He reports he has never had this before.     Past Medical History:  Diagnosis Date   Asthma     Patient Active Problem List   Diagnosis Date Noted   Adjustment disorder with mixed anxiety and depressed mood 07/05/2019    History reviewed. No pertinent surgical history.     Family History  Problem Relation Age of Onset   Diabetes Father    Hypertension Father    Renal Disease Father    Asthma Father     Social History   Tobacco Use   Smoking status: Never Smoker   Smokeless tobacco: Never Used  Substance Use Topics   Alcohol use: Yes    Comment: rarely   Drug use: Never    Home Medications Prior to Admission medications   Not on File    Allergies    Patient has no known allergies.  Review of Systems   Review of Systems  Constitutional: Negative for fever.  Respiratory: Negative for shortness of breath.   Cardiovascular: Negative for chest pain.  Gastrointestinal: Positive for abdominal pain, nausea and vomiting.  Genitourinary: Negative for dysuria.  All other systems reviewed and are negative.   Physical Exam Updated Vital Signs BP (!) 146/74 (BP Location: Right Arm)    Pulse 79    Temp 98.6 F (37 C)  (Oral)    Resp 18    Ht 1.772 m (5' 9.75")    Wt 81.6 kg    SpO2 100%    BMI 26.01 kg/m   Physical Exam CONSTITUTIONAL: Well developed/well nourished, uncomfortable appearing HEAD: Normocephalic/atraumatic EYES: EOMI/PERRL, no icterus ENMT: Mucous membranes moist NECK: supple no meningeal signs SPINE/BACK:entire spine nontender CV: S1/S2 noted, no murmurs/rubs/gallops noted LUNGS: Lungs are clear to auscultation bilaterally, no apparent distress ABDOMEN: soft, moderate lower abdominal tenderness, no rebound or guarding, bowel sounds noted throughout abdomen GU:no cva tenderness NEURO: Pt is awake/alert/appropriate, moves all extremitiesx4.  No facial droop.   EXTREMITIES: pulses normal/equal, full ROM SKIN: warm, color normal PSYCH: anxious  ED Results / Procedures / Treatments   Labs (all labs ordered are listed, but only abnormal results are displayed) Labs Reviewed  COMPREHENSIVE METABOLIC PANEL - Abnormal; Notable for the following components:      Result Value   Glucose, Bld 139 (*)    All other components within normal limits  CBC WITH DIFFERENTIAL/PLATELET - Abnormal; Notable for the following components:   WBC 14.8 (*)    RDW 11.4 (*)    Neutro Abs 11.5 (*)    All other components within normal limits  LIPASE, BLOOD  URINALYSIS, ROUTINE W REFLEX MICROSCOPIC    EKG None  Radiology No results found.  Procedures Procedures   Medications Ordered in  ED Medications  albuterol (VENTOLIN HFA) 108 (90 Base) MCG/ACT inhaler 2 puff (2 puffs Inhalation Given 02/16/20 0520)  haloperidol lactate (HALDOL) injection 2 mg (2 mg Intravenous Given 02/16/20 0550)  lactated ringers bolus 1,000 mL (1,000 mLs Intravenous New Bag/Given 02/16/20 0609)  fentaNYL (SUBLIMAZE) injection 100 mcg (100 mcg Intravenous Given 02/16/20 1191)    ED Course  I have reviewed the triage vital signs and the nursing notes.  Pertinent labs results that were available during my care of the patient were  reviewed by me and considered in my medical decision making (see chart for details).    MDM Rules/Calculators/A&P                          6:51 AM Patient presented with abdominal pain and vomiting.  Patient reports he has never had this before.  Patient did not respond to antiemetics and pain medications.  He still has lower abdominal pain.  We will proceed with CT imaging Signed out to Dr. Charm Barges at shift change    This patient presents to the ED for concern of abdominal pain, this involves an extensive number of treatment options, and is a complaint that carries with it a high risk of complications and morbidity.  The differential diagnosis includes appendicitis, diverticulitis, kidney stone, pyelonephritis   Lab Tests:   I Ordered, reviewed, and interpreted labs, which included analysis, lipase, electrolytes, include blood count  Medicines ordered:   I ordered medication fentanyl and Haldol for pain and nausea  Imaging Studies ordered:   I ordered imaging studies which included CT abd/pelvis     Additional history obtained:   Previous records obtained and reviewed    Reevaluation:  After the interventions stated above, I reevaluated the patient and found pt still with abdominal pain  Final Clinical Impression(s) / ED Diagnoses Final diagnoses:  None    Rx / DC Orders ED Discharge Orders    None       Zadie Rhine, MD 02/16/20 [phone]

## 2020-02-16 NOTE — Anesthesia Preprocedure Evaluation (Signed)
Anesthesia Evaluation  Patient identified by MRN, date of birth, ID band Patient awake    Reviewed: Allergy & Precautions, NPO status , Patient's Chart, lab work & pertinent test results  Airway Mallampati: I  TM Distance: >3 FB Neck ROM: Full    Dental   Pulmonary asthma ,    Pulmonary exam normal        Cardiovascular Normal cardiovascular exam     Neuro/Psych    GI/Hepatic   Endo/Other    Renal/GU      Musculoskeletal   Abdominal   Peds  Hematology   Anesthesia Other Findings   Reproductive/Obstetrics                             Anesthesia Physical Anesthesia Plan  ASA: II and emergent  Anesthesia Plan: General   Post-op Pain Management:    Induction: Intravenous, Rapid sequence and Cricoid pressure planned  PONV Risk Score and Plan: 2 and Ondansetron and Midazolam  Airway Management Planned: Oral ETT  Additional Equipment:   Intra-op Plan:   Post-operative Plan: Extubation in OR  Informed Consent: I have reviewed the patients History and Physical, chart, labs and discussed the procedure including the risks, benefits and alternatives for the proposed anesthesia with the patient or authorized representative who has indicated his/her understanding and acceptance.       Plan Discussed with: CRNA and Surgeon  Anesthesia Plan Comments:         Anesthesia Quick Evaluation

## 2020-02-16 NOTE — ED Notes (Signed)
Consent signed.

## 2020-02-16 NOTE — ED Provider Notes (Signed)
Signout from Dr. Bebe Shaggy.  24 year old male here with lower abdominal pain.  He has an elevated white count and is pending a CT abdomen pelvis. Physical Exam  BP (!) 146/74 (BP Location: Right Arm)   Pulse 79   Temp 98.6 F (37 C) (Oral)   Resp 18   Ht 5' 9.75" (1.772 m)   Wt 81.6 kg   SpO2 100%   BMI 26.01 kg/m   Physical Exam  ED Course/Procedures     Procedures  MDM  CT positive for probable acute appendicitis.  Reviewed with patient.  He still having pain diffusely tender lower abdomen.  Have ordered IV antibiotics and more pain medicine.  Keep n.p.o.  Consult general surgery.  Also ordered a Covid test.  8:50 AM discussed with PA Meuth  from general surgery who will evaluate the patient.       Terrilee Files, MD 02/16/20 [phone]

## 2020-02-16 NOTE — ED Triage Notes (Signed)
Pt here from home with c/o lower abd pain along with n/v Denis alcohol and drug use

## 2020-02-16 NOTE — Anesthesia Procedure Notes (Signed)
Procedure Name: Intubation Date/Time: 02/16/2020 12:14 PM Performed by: Sammie Bench, CRNA Pre-anesthesia Checklist: Patient identified, Emergency Drugs available, Suction available and Patient being monitored Patient Re-evaluated:Patient Re-evaluated prior to induction Oxygen Delivery Method: Circle System Utilized Preoxygenation: Pre-oxygenation with 100% oxygen Induction Type: IV induction, Cricoid Pressure applied and Rapid sequence Ventilation: Mask ventilation without difficulty Laryngoscope Size: Mac and 4 Grade View: Grade II Tube type: Oral Tube size: 8.0 mm Number of attempts: 1 Airway Equipment and Method: Stylet and Oral airway Placement Confirmation: ETT inserted through vocal cords under direct vision,  positive ETCO2 and breath sounds checked- equal and bilateral Secured at: 23 cm Tube secured with: Tape Dental Injury: Teeth and Oropharynx as per pre-operative assessment

## 2020-02-17 ENCOUNTER — Encounter (HOSPITAL_COMMUNITY): Payer: Self-pay | Admitting: General Surgery

## 2020-02-19 LAB — SURGICAL PATHOLOGY

## 2020-03-03 ENCOUNTER — Encounter: Payer: Self-pay | Admitting: Emergency Medicine

## 2020-03-03 ENCOUNTER — Ambulatory Visit
Admission: EM | Admit: 2020-03-03 | Discharge: 2020-03-03 | Disposition: A | Discharge status: Home / Self Care | Payer: Self-pay | Attending: Emergency Medicine | Admitting: Emergency Medicine

## 2020-03-03 ENCOUNTER — Other Ambulatory Visit: Payer: Self-pay

## 2020-03-03 DIAGNOSIS — Z114 Encounter for screening for human immunodeficiency virus [HIV]: Secondary | ICD-10-CM | POA: Insufficient documentation

## 2020-03-03 DIAGNOSIS — Z113 Encounter for screening for infections with a predominantly sexual mode of transmission: Secondary | ICD-10-CM | POA: Insufficient documentation

## 2020-03-03 NOTE — Discharge Instructions (Addendum)

## 2020-03-03 NOTE — ED Provider Notes (Signed)
[name]    CSN: [id number] Arrival date & time: 03/03/20  1607      History   Chief Complaint Chief Complaint  Patient presents with  . Exposure to STD    HPI Thomas Merritt is a 24 y.o. male  Presenting for STD testing.  Currently sexually active with more than 1 male partner, not routinely using condoms.  She noticed a bump at the head of his penis this morning that is nontender.  Denies discharge, dysuria, penile or testicular pain/swelling, abdominal pain, back pain, fever.  Past Medical History:  Diagnosis Date  . Asthma     Patient Active Problem List   Diagnosis Date Noted  . Adjustment disorder with mixed anxiety and depressed mood 07/05/2019    Past Surgical History:  Procedure Laterality Date  . LAPAROSCOPIC APPENDECTOMY N/A 02/16/2020   Procedure: APPENDECTOMY LAPAROSCOPIC;  Surgeon: Gaynelle Adu, MD;  Location: [facility] OR;  Service: General;  Laterality: N/A;       Home Medications    Prior to Admission medications   Not on File    Family History Family History  Problem Relation Age of Onset  . Diabetes Father   . Hypertension Father   . Renal Disease Father   . Asthma Father     Social History Social History   Tobacco Use  . Smoking status: Never Smoker  . Smokeless tobacco: Never Used  Substance Use Topics  . Alcohol use: Yes    Comment: rarely  . Drug use: Never     Allergies   Patient has no known allergies.   Review of Systems Review of Systems  Constitutional: Negative for fever.  Respiratory: Negative for shortness of breath.   Cardiovascular: Negative for chest pain.  All other systems reviewed and are negative.    Physical Exam Triage Vital Signs ED Triage Vitals  Enc Vitals Group     BP      Pulse      Resp      Temp      Temp src      SpO2      Weight      Height      Head Circumference      Peak Flow      Pain Score      Pain Loc      Pain Edu?      Excl. in GC?    No data  found.  Updated Vital Signs BP 125/74 (BP Location: Left Arm)   Pulse 65   Temp 98.1 F (36.7 C) (Oral)   Resp 18   SpO2 98%   Visual Acuity Right Eye Distance:   Left Eye Distance:   Bilateral Distance:    Right Eye Near:   Left Eye Near:    Bilateral Near:     Physical Exam Constitutional:      General: He is not in acute distress. HENT:     Head: Normocephalic and atraumatic.  Eyes:     General: No scleral icterus.    Pupils: Pupils are equal, round, and reactive to light.  Cardiovascular:     Rate and Rhythm: Normal rate.  Pulmonary:     Effort: Pulmonary effort is normal. No respiratory distress.     Breath sounds: No wheezing.  Genitourinary:    Comments: No penile lesion or discharge noted Skin:    Coloration: Skin is not jaundiced or pale.  Neurological:     Mental Status: He is  alert and oriented to person, place, and time.      UC Treatments / Results  Labs (all labs ordered are listed, but only abnormal results are displayed) Labs Reviewed  RPR  HIV ANTIBODY (ROUTINE TESTING W REFLEX)  CYTOLOGY, (ORAL, ANAL, URETHRAL) ANCILLARY ONLY    EKG   Radiology No results found.  Procedures Procedures (including critical care time)  Medications Ordered in UC Medications - No data to display  Initial Impression / Assessment and Plan / UC Course  I have reviewed the triage vital signs and the nursing notes.  Pertinent labs & imaging results that were available during my care of the patient were reviewed by me and considered in my medical decision making (see chart for details).     Cytology, HIV/RPR pending: No history of HIV or RPR. S/p appendectomy 02/16/20; no sequela.   Return precautions discussed, pt verbalized understanding and is agreeable to plan. Final Clinical Impressions(s) / UC Diagnoses   Final diagnoses:  Screening examination for venereal disease  Screening for human immunodeficiency virus     Discharge Instructions       Testing for chlamydia, gonorrhea, trichomonas is pending: please look for these results on the MyChart app/website.  We will notify you if you are positive and outline treatment at that time.  Important to avoid all forms of sexual intercourse (oral, vaginal, anal) with any/all partners for the next 7 days to avoid spreading/reinfecting. Any/all sexual partners should be notified of testing/treatment today.  Return for persistent/worsening symptoms or if you develop fever, abdominal or pelvic pain, discharge, genital pain, blood in your urine, or are re-exposed to an STI.    ED Prescriptions    None     PDMP not reviewed this encounter.   [place], [place], [place] 03/03/20 [phone]

## 2020-03-03 NOTE — ED Triage Notes (Signed)
Pt here for STD testing; pt sts has bump on head on penis

## 2020-03-04 LAB — CYTOLOGY, (ORAL, ANAL, URETHRAL) ANCILLARY ONLY
Chlamydia: NEGATIVE
Comment: NEGATIVE
Comment: NEGATIVE
Comment: NORMAL
Neisseria Gonorrhea: NEGATIVE
Trichomonas: NEGATIVE

## 2020-03-04 LAB — RPR: RPR Ser Ql: NONREACTIVE

## 2020-03-04 LAB — HIV ANTIBODY (ROUTINE TESTING W REFLEX): HIV Screen 4th Generation wRfx: NONREACTIVE

## 2020-04-14 ENCOUNTER — Ambulatory Visit
Admission: EM | Admit: 2020-04-14 | Discharge: 2020-04-14 | Disposition: A | Discharge status: Home / Self Care | Payer: Self-pay | Attending: Emergency Medicine | Admitting: Emergency Medicine

## 2020-04-14 ENCOUNTER — Other Ambulatory Visit: Payer: Self-pay

## 2020-04-14 DIAGNOSIS — Z7251 High risk heterosexual behavior: Secondary | ICD-10-CM

## 2020-04-14 DIAGNOSIS — Z113 Encounter for screening for infections with a predominantly sexual mode of transmission: Secondary | ICD-10-CM

## 2020-04-14 NOTE — ED Provider Notes (Signed)
[name]    CSN: [id number] Arrival date & time: 04/14/20  0858      History   Chief Complaint Chief Complaint  Patient presents with  . SEXUALLY TRANSMITTED DISEASE    x 2 weeks    HPI Thomas Merritt is a 24 y.o. male   Presenting for STI check.  Denies known exposure, penile discharge or testicular pain, penile pain or swelling.  No urinary symptoms.  Concerned about bumps in the head of his penis.  States he is are nontender and without erythema.  Seen by me on 03/03/2020 for the same: States "these are different".  Past Medical History:  Diagnosis Date  . Asthma     Patient Active Problem List   Diagnosis Date Noted  . Adjustment disorder with mixed anxiety and depressed mood 07/05/2019    Past Surgical History:  Procedure Laterality Date  . LAPAROSCOPIC APPENDECTOMY N/A 02/16/2020   Procedure: APPENDECTOMY LAPAROSCOPIC;  Surgeon: Gaynelle Adu, MD;  Location: [facility] OR;  Service: General;  Laterality: N/A;       Home Medications    Prior to Admission medications   Not on File    Family History Family History  Problem Relation Age of Onset  . Diabetes Father   . Hypertension Father   . Renal Disease Father   . Asthma Father     Social History Social History   Tobacco Use  . Smoking status: Never Smoker  . Smokeless tobacco: Never Used  Vaping Use  . Vaping Use: Never used  Substance Use Topics  . Alcohol use: Yes    Comment: rarely  . Drug use: Never     Allergies   Patient has no known allergies.   Review of Systems As per HPI   Physical Exam Triage Vital Signs ED Triage Vitals  Enc Vitals Group     BP      Pulse      Resp      Temp      Temp src      SpO2      Weight      Height      Head Circumference      Peak Flow      Pain Score      Pain Loc      Pain Edu?      Excl. in GC?    No data found.  Updated Vital Signs BP 131/84 (BP Location: Left Arm)   Pulse 81   Temp 98.4 F (36.9 C) (Oral)    Resp 17   SpO2 99%   Visual Acuity Right Eye Distance:   Left Eye Distance:   Bilateral Distance:    Right Eye Near:   Left Eye Near:    Bilateral Near:     Physical Exam Exam conducted with a chaperone present.  Constitutional:      General: He is not in acute distress. HENT:     Head: Normocephalic and atraumatic.  Eyes:     General: No scleral icterus.    Pupils: Pupils are equal, round, and reactive to light.  Cardiovascular:     Rate and Rhythm: Normal rate.  Pulmonary:     Effort: Pulmonary effort is normal. No respiratory distress.     Breath sounds: No wheezing.  Genitourinary:    Penis: Normal.      Comments: Pt guided: no rash/bumps noted. Skin:    Coloration: Skin is not jaundiced or pale.  Neurological:  Mental Status: He is alert and oriented to person, place, and time.      UC Treatments / Results  Labs (all labs ordered are listed, but only abnormal results are displayed) Labs Reviewed  CYTOLOGY, (ORAL, ANAL, URETHRAL) ANCILLARY ONLY    EKG   Radiology No results found.  Procedures Procedures (including critical care time)  Medications Ordered in UC Medications - No data to display  Initial Impression / Assessment and Plan / UC Course  I have reviewed the triage vital signs and the nursing notes.  Pertinent labs & imaging results that were available during my care of the patient were reviewed by me and considered in my medical decision making (see chart for details).     Patient appears well in office today.  Last seen by me for screening on 03/03/2020: RPR, HIV, cytology negative.  Will repeat cytology.  No lesions on head of penis again.  Discussed patient is to follow-up with his PCP for further genital evaluation as there are no appreciable lesions with me x2.  Return precautions discussed, pt verbalized understanding and is agreeable to plan. Final Clinical Impressions(s) / UC Diagnoses   Final diagnoses:  Screening  examination for venereal disease  Unprotected sex     Discharge Instructions     Testing for chlamydia, gonorrhea, trichomonas is pending: please look for these results on the MyChart app/website.  We will notify you if you are positive and outline treatment at that time.  Important to avoid all forms of sexual intercourse (oral, vaginal, anal) with any/all partners for the next 7 days to avoid spreading/reinfecting. Any/all sexual partners should be notified of testing/treatment today.  Return for persistent/worsening symptoms or if you develop fever, abdominal or pelvic pain, discharge, genital pain, blood in your urine, or are re-exposed to an STI.    ED Prescriptions    None     PDMP not reviewed this encounter.   Hall[place]Potvin, [place], [place] 04/14/20 1029

## 2020-04-14 NOTE — Discharge Instructions (Addendum)

## 2020-04-14 NOTE — ED Triage Notes (Signed)
Pt states he has had bumps on the head area of his penis x 2 weeks. Pt is aox4 and ambulatory.

## 2020-04-15 LAB — CYTOLOGY, (ORAL, ANAL, URETHRAL) ANCILLARY ONLY
Chlamydia: NEGATIVE
Comment: NEGATIVE
Comment: NEGATIVE
Comment: NORMAL
Neisseria Gonorrhea: NEGATIVE
Trichomonas: NEGATIVE

## 2020-11-14 ENCOUNTER — Other Ambulatory Visit: Payer: Self-pay

## 2020-11-14 ENCOUNTER — Emergency Department (HOSPITAL_COMMUNITY)
Admission: EM | Admit: 2020-11-14 | Discharge: 2020-11-14 | Disposition: A | Discharge status: Home / Self Care | Payer: Medicaid Other | Attending: Emergency Medicine | Admitting: Emergency Medicine

## 2020-11-14 ENCOUNTER — Emergency Department (HOSPITAL_COMMUNITY): Payer: Medicaid Other

## 2020-11-14 ENCOUNTER — Encounter (HOSPITAL_COMMUNITY): Payer: Self-pay | Admitting: Emergency Medicine

## 2020-11-14 DIAGNOSIS — Z20822 Contact with and (suspected) exposure to covid-19: Secondary | ICD-10-CM | POA: Insufficient documentation

## 2020-11-14 DIAGNOSIS — J069 Acute upper respiratory infection, unspecified: Secondary | ICD-10-CM | POA: Insufficient documentation

## 2020-11-14 DIAGNOSIS — J45909 Unspecified asthma, uncomplicated: Secondary | ICD-10-CM | POA: Insufficient documentation

## 2020-11-14 DIAGNOSIS — Z8616 Personal history of COVID-19: Secondary | ICD-10-CM | POA: Insufficient documentation

## 2020-11-14 LAB — CBC
HCT: 41.9 % (ref 39.0–52.0)
Hemoglobin: 13.6 g/dL (ref 13.0–17.0)
MCH: 31.6 pg (ref 26.0–34.0)
MCHC: 32.5 g/dL (ref 30.0–36.0)
MCV: 97.2 fL (ref 80.0–100.0)
Platelets: 263 10*3/uL (ref 150–400)
RBC: 4.31 MIL/uL (ref 4.22–5.81)
RDW: 11.7 % (ref 11.5–15.5)
WBC: 8.9 10*3/uL (ref 4.0–10.5)
nRBC: 0 % (ref 0.0–0.2)

## 2020-11-14 LAB — COMPREHENSIVE METABOLIC PANEL
ALT: 15 U/L (ref 0–44)
AST: 20 U/L (ref 15–41)
Albumin: 4.1 g/dL (ref 3.5–5.0)
Alkaline Phosphatase: 66 U/L (ref 38–126)
Anion gap: 6 (ref 5–15)
BUN: 10 mg/dL (ref 6–20)
CO2: 28 mmol/L (ref 22–32)
Calcium: 9.5 mg/dL (ref 8.9–10.3)
Chloride: 103 mmol/L (ref 98–111)
Creatinine, Ser: 1 mg/dL (ref 0.61–1.24)
GFR, Estimated: >60 mL/min (ref >60)
Glucose, Bld: 97 mg/dL (ref 70–99)
Potassium: 4 mmol/L (ref 3.5–5.1)
Sodium: 137 mmol/L (ref 135–145)
Total Bilirubin: 0.7 mg/dL (ref 0.3–1.2)
Total Protein: 7.3 g/dL (ref 6.5–8.1)

## 2020-11-14 LAB — SARS CORONAVIRUS 2 (TAT 6-24 HRS): SARS Coronavirus 2: NEGATIVE

## 2020-11-14 MED ORDER — ALBUTEROL SULFATE HFA 108 (90 BASE) MCG/ACT IN AERS
2.0000 | INHALATION_SPRAY | RESPIRATORY_TRACT | Status: DC | PRN
  Administered 2020-11-14: 2 via RESPIRATORY_TRACT
  Filled 2020-11-14: qty 6.7

## 2020-11-14 MED ORDER — PREDNISONE 20 MG PO TABS
40.0000 mg | ORAL_TABLET | Freq: Every day | ORAL | 0 refills | Status: AC
Start: 2020-11-14 — End: ?

## 2020-11-14 NOTE — ED Triage Notes (Signed)
Pt here from home with c/o congestion and cough times 3 weeks , pt had a ned pcr covid test

## 2020-11-14 NOTE — ED Notes (Signed)
Pt d/c home per MD order. Discharge summary reviewed with pt, pt verbalizes understanding. No s/s of acute distress noted at discharge. Ambulatory.  

## 2020-11-14 NOTE — Discharge Instructions (Addendum)
Your COVID test has been done but has not resulted yet.  Although you have already gone through the time we would want you quarantined.  Follow-up with a primary care doctor symptoms do not improve.

## 2020-11-14 NOTE — ED Provider Notes (Signed)
[facility] EMERGENCY DEPARTMENT Provider Note   CSN: [id number] Arrival date & time: 11/14/20  7078     History No chief complaint on file.   Thomas Merritt is a 25 y.o. male.  HPI Patient presents with around 3 weeks of cough and congestion.  Mild sputum production.  No fevers.  States he did have some asthma previously but does not usually have to use an inhaler.  No fevers or chills.  States he did have 1 positive COVID test around 2 weeks ago.  States he then followed up with another test was negative.  Then had a PCR test that was negative.  States he has taken cough medicine without relief.    Past Medical History:  Diagnosis Date  . Asthma     Patient Active Problem List   Diagnosis Date Noted  . Adjustment disorder with mixed anxiety and depressed mood 07/05/2019    Past Surgical History:  Procedure Laterality Date  . LAPAROSCOPIC APPENDECTOMY N/A 02/16/2020   Procedure: APPENDECTOMY LAPAROSCOPIC;  Surgeon: Gaynelle Adu, MD;  Location: [facility] OR;  Service: General;  Laterality: N/A;       Family History  Problem Relation Age of Onset  . Diabetes Father   . Hypertension Father   . Renal Disease Father   . Asthma Father     Social History   Tobacco Use  . Smoking status: Never Smoker  . Smokeless tobacco: Never Used  Vaping Use  . Vaping Use: Never used  Substance Use Topics  . Alcohol use: Yes    Comment: rarely  . Drug use: Never    Home Medications Prior to Admission medications   Medication Sig Start Date End Date Taking? Authorizing Provider  predniSONE (DELTASONE) 20 MG tablet Take 2 tablets (40 mg total) by mouth daily. 11/14/20  Yes Benjiman Core, MD    Allergies    Patient has no known allergies.  Review of Systems   Review of Systems  Constitutional: Negative for appetite change and fatigue.  HENT: Positive for congestion.   Respiratory: Positive for cough and wheezing.   Cardiovascular: Negative for chest  pain.  Gastrointestinal: Negative for abdominal pain.  Genitourinary: Negative for flank pain.  Musculoskeletal: Negative for back pain.  Skin: Negative for rash.  Neurological: Negative for weakness.  Psychiatric/Behavioral: Negative for confusion.    Physical Exam Updated Vital Signs BP 121/68   Pulse (!) 56   Temp 98.5 F (36.9 C)   Resp 16   SpO2 99%   Physical Exam Vitals and nursing note reviewed.  HENT:     Head: Normocephalic and atraumatic.     Nose: Congestion present.     Mouth/Throat:     Pharynx: Posterior oropharyngeal erythema present. No oropharyngeal exudate.  Eyes:     General: No scleral icterus. Cardiovascular:     Rate and Rhythm: Regular rhythm.  Pulmonary:     Breath sounds: No wheezing, rhonchi or rales.  Abdominal:     Tenderness: There is no abdominal tenderness.  Musculoskeletal:        General: No tenderness.     Cervical back: Neck supple.  Skin:    General: Skin is warm.     Capillary Refill: Capillary refill takes less than 2 seconds.  Neurological:     Mental Status: He is alert and oriented to person, place, and time.  Psychiatric:        Mood and Affect: Mood normal.     ED Results /  Procedures / Treatments   Labs (all labs ordered are listed, but only abnormal results are displayed) Labs Reviewed  SARS CORONAVIRUS 2 (TAT 6-24 HRS)  CBC  COMPREHENSIVE METABOLIC PANEL    EKG None  Radiology DG Chest 2 View  Result Date: 11/14/2020 CLINICAL DATA:  Cough and shortness of breath EXAM: CHEST - 2 VIEW COMPARISON:  None. FINDINGS: Lungs are clear. Heart size and pulmonary vascularity are normal. No adenopathy. No bone lesions. IMPRESSION: Lungs clear.  Cardiac silhouette normal. Electronically Signed   By: Bretta Bang III M.D.   On: 11/14/2020 08:06    Procedures Procedures   Medications Ordered in ED Medications  albuterol (VENTOLIN HFA) 108 (90 Base) MCG/ACT inhaler 2 puff (2 puffs Inhalation Given 11/14/20 0909)     ED Course  I have reviewed the triage vital signs and the nursing notes.  Pertinent labs & imaging results that were available during my care of the patient were reviewed by me and considered in my medical decision making (see chart for details).    MDM Rules/Calculators/A&P                          Patient with URI symptoms.  Has had for around 3 weeks.  Both positive and negative COVID test, although he is out of the side of the window where he would need isolation or masking.  Chest x-ray done reassuring.  Lungs are clear.  With history of asthma we will give an inhaler and some steroids.  Lungs are clear and do not feels over need antibiotics this time.  Will discharge home. Final Clinical Impression(s) / ED Diagnoses Final diagnoses:  Upper respiratory tract infection, unspecified type    Rx / DC Orders ED Discharge Orders         Ordered    predniSONE (DELTASONE) 20 MG tablet  Daily        11/14/20 0906           Benjiman Core, MD 11/14/20 [phone]

## 2020-11-24 ENCOUNTER — Emergency Department (HOSPITAL_COMMUNITY)
Admission: EM | Admit: 2020-11-24 | Discharge: 2020-11-24 | Disposition: A | Discharge status: Home / Self Care | Payer: Self-pay | Attending: Emergency Medicine | Admitting: Emergency Medicine

## 2020-11-24 ENCOUNTER — Emergency Department (HOSPITAL_COMMUNITY): Payer: Self-pay

## 2020-11-24 ENCOUNTER — Other Ambulatory Visit: Payer: Self-pay

## 2020-11-24 ENCOUNTER — Encounter (HOSPITAL_COMMUNITY): Payer: Self-pay

## 2020-11-24 DIAGNOSIS — J45909 Unspecified asthma, uncomplicated: Secondary | ICD-10-CM | POA: Insufficient documentation

## 2020-11-24 DIAGNOSIS — M25511 Pain in right shoulder: Secondary | ICD-10-CM | POA: Insufficient documentation

## 2020-11-24 MED ORDER — IBUPROFEN 800 MG PO TABS
800.0000 mg | ORAL_TABLET | Freq: Once | ORAL | Status: AC
  Administered 2020-11-24: 800 mg via ORAL
  Filled 2020-11-24: qty 1

## 2020-11-24 MED ORDER — LIDOCAINE 5 % EX PTCH
1.0000 | MEDICATED_PATCH | CUTANEOUS | Status: DC
  Administered 2020-11-24: 1 via TRANSDERMAL
  Filled 2020-11-24: qty 1

## 2020-11-24 NOTE — ED Triage Notes (Signed)
Pt reports last night he swung his Right arm back to far and heard something pop. Did not hit anything, was trying to throw his phone. Took pain medication at home w.no relief pt reports some tingling to his arm

## 2020-11-24 NOTE — ED Provider Notes (Signed)
Emergency Medicine Provider Triage Evaluation Note  Thomas Merritt , a 25 y.o. male  was evaluated in triage.  Pt complains of right shoulder pain. He was angry last night and went to throw is phone; felt a "pop and a crunch" in his right shoulder on the back swing.  Review of Systems  Positive: Right shoulder pain, decreased ROM Negative: Numbness, tingling, weakness in the right hand  Physical Exam  BP (!) 141/87 (BP Location: Left Arm)   Pulse 91   Temp 98.3 F (36.8 C) (Oral)   Resp 16   SpO2 97%  Gen:   Awake, no distress   Resp:  Normal effort  MSK:   Full active flexion, decreased extension, full active abduction. No active internal or external rotation, positive empty can test.  Other:  Normal sensation, cap refill, and radial pulse in the right hand.  Medical Decision Making  Medically screening exam initiated at 9:32 AM.  Appropriate orders placed.  Thomas Merritt was informed that the remainder of the evaluation will be completed by another provider, this initial triage assessment does not replace that evaluation, and the importance of remaining in the ED until their evaluation is complete.  This chart was dictated using voice recognition software, Dragon. Despite the best efforts of this provider to proofread and correct errors, errors may still occur which can change documentation meaning.    Paris Lore, PA-C 11/24/20 0935    Milagros Loll, MD 11/25/20 1135

## 2020-11-24 NOTE — ED Provider Notes (Signed)
[facility] EMERGENCY DEPARTMENT Provider Note   CSN: [id number] Arrival date & time: 11/24/20  5643     History Chief Complaint  Patient presents with  . Shoulder Pain    Thomas Merritt is a 25 y.o. male who presents with complaint of of right shoulder pain. He was angry last night and went to throw is phone; felt a "pop and a crunch" in his right shoulder on the back swing prior to his throw.   No hx of injury to this shoulder. Pain not improved with ibuprofen or "sleeping it off".  I have personally reviewed this patient's medical record. He has hx of asthma, appendectomy.  HPI     Past Medical History:  Diagnosis Date  . Asthma     Patient Active Problem List   Diagnosis Date Noted  . Adjustment disorder with mixed anxiety and depressed mood 07/05/2019    Past Surgical History:  Procedure Laterality Date  . LAPAROSCOPIC APPENDECTOMY N/A 02/16/2020   Procedure: APPENDECTOMY LAPAROSCOPIC;  Surgeon: Gaynelle Adu, MD;  Location: [facility] OR;  Service: General;  Laterality: N/A;       Family History  Problem Relation Age of Onset  . Diabetes Father   . Hypertension Father   . Renal Disease Father   . Asthma Father     Social History   Tobacco Use  . Smoking status: Never Smoker  . Smokeless tobacco: Never Used  Vaping Use  . Vaping Use: Never used  Substance Use Topics  . Alcohol use: Yes    Comment: rarely  . Drug use: Never    Home Medications Prior to Admission medications   Medication Sig Start Date End Date Taking? Authorizing Provider  predniSONE (DELTASONE) 20 MG tablet Take 2 tablets (40 mg total) by mouth daily. 11/14/20   Benjiman Core, MD    Allergies    Patient has no known allergies.  Review of Systems   Review of Systems  Constitutional: Negative.   HENT: Negative.   Respiratory: Negative.   Cardiovascular: Negative.   Gastrointestinal: Negative.   Musculoskeletal: Positive for arthralgias.       Right  shoulder pain  Skin: Negative.   Neurological: Negative for weakness and numbness.    Physical Exam Updated Vital Signs BP 134/82   Pulse 82   Temp 98.3 F (36.8 C) (Oral)   Resp 16   SpO2 100%   Physical Exam Vitals and nursing note reviewed.  Constitutional:      Appearance: He is normal weight. He is not ill-appearing or toxic-appearing.  HENT:     Head: Normocephalic and atraumatic.  Eyes:     General: No scleral icterus.       Right eye: No discharge.        Left eye: No discharge.     Conjunctiva/sclera: Conjunctivae normal.  Neck:     Trachea: Trachea normal.  Cardiovascular:     Rate and Rhythm: Normal rate.     Pulses:          Radial pulses are 2+ on the right side and 2+ on the left side.     Heart sounds: Normal heart sounds.  Pulmonary:     Effort: Pulmonary effort is normal. No tachypnea, bradypnea, accessory muscle usage, prolonged expiration, respiratory distress or retractions.  Chest:     Chest wall: No mass, lacerations, deformity, swelling, tenderness, crepitus or edema.  Abdominal:     Palpations: Abdomen is soft.     Tenderness:  There is no abdominal tenderness.  Musculoskeletal:     Right shoulder: No swelling, deformity, tenderness, bony tenderness or crepitus. Decreased range of motion. Normal strength. Normal pulse.     Left shoulder: Normal.     Right upper arm: Normal.     Left upper arm: Normal.     Right elbow: Normal.     Left elbow: Normal.     Right forearm: Normal.     Left forearm: Normal.     Right wrist: Normal.     Left wrist: Normal.     Cervical back: Normal range of motion. No edema, rigidity or crepitus. No pain with movement, spinous process tenderness or muscular tenderness.     Right lower leg: No edema.     Left lower leg: No edema.     Comments: No tenderness to external or internal rotation.  Patient unwilling to allow this provider to passively range the shoulder.  Full abduction/adduction of the right shoulder.  Full extension, but minimized flexion Positive empty can test.  5/5 grip strength in hands bilaterally.  Lymphadenopathy:     Cervical: No cervical adenopathy.  Skin:    General: Skin is warm and dry.     Capillary Refill: Capillary refill takes less than 2 seconds.  Neurological:     General: No focal deficit present.     Mental Status: He is alert.     Sensory: Sensation is intact.     Motor: Motor function is intact.     Gait: Gait is intact.  Psychiatric:        Mood and Affect: Mood normal.     ED Results / Procedures / Treatments   Labs (all labs ordered are listed, but only abnormal results are displayed) Labs Reviewed - No data to display  EKG None  Radiology DG Shoulder Right  Result Date: 11/24/2020 CLINICAL DATA:  Posttraumatic right shoulder pain. EXAM: RIGHT SHOULDER - 2+ VIEW COMPARISON:  None. FINDINGS: There is no evidence of fracture or dislocation. There is no evidence of arthropathy or other focal bone abnormality. Soft tissues are unremarkable. IMPRESSION: Negative. Electronically Signed   By: Marnee Spring M.D.   On: 11/24/2020 10:05    Procedures Procedures   Medications Ordered in ED Medications  lidocaine (LIDODERM) 5 % 1 patch (1 patch Transdermal Patch Applied 11/24/20 1047)  ibuprofen (ADVIL) tablet 800 mg (800 mg Oral Given 11/24/20 1047)    ED Course  I have reviewed the triage vital signs and the nursing notes.  Pertinent labs & imaging results that were available during my care of the patient were reviewed by me and considered in my medical decision making (see chart for details).    MDM Rules/Calculators/A&P                         25 year old male presents with concern for right shoulder pain that started yesterday after he tried to throw his phone.  Differential diagnosis includes is not limited to acute muscular strain, dislocation, biceps tendon rupture, AC joint injury, rotator cuff injury, tendinitis or  bursitis.  Hypertensive on intake, vital signs otherwise normal.  Pulmonary exam is normal, abdominal exam is benign.  Physical exam is concerning for decreased range of motion with internal and external rotation as well as positive empty can test concerning for a possible supraspinatus/rotator cuff injury.  Patient is neurovascularly intact in the upper extremities bilaterally.  Symmetric strength and sensation in the upper extremities.  Plain film of the right shoulder negative for acute fracture or dislocation.  Given reassuring physical exam and vital signs, no further work-up is warranted in ED at this time.  Will place placement in splint and recommend heat and NSAID analgesic as needed.  Will offer sports medicine follow-up as well.  Suleman voiced understanding with medical evaluation and treatment plan.  Each of his questions was answered to his expressed satisfaction.  Return precautions are given.  Patient is well-appearing, stable, appropriate for discharge at this time.  This chart was dictated using voice recognition software, Dragon. Despite the best efforts of this provider to proofread and correct errors, errors may still occur which can change documentation meaning.  Final Clinical Impression(s) / ED Diagnoses Final diagnoses:  Acute pain of right shoulder    Rx / DC Orders ED Discharge Orders    None       Sherrilee Gilles 11/24/20 1219    Milagros Loll, MD 11/26/20 2103

## 2020-11-24 NOTE — Discharge Instructions (Signed)
You were seen in the ER today x-ray and vital signs are reassuring.  Your x-ray was negative based on physical exam suspect you have injured your rotator cuff. You have been placed in a sling for comfort and may take ibuprofen and Tylenol as needed.  May alternate every 3 hours.  Please follow up with Dr. Jena Gauss, sports medicine provider for further follow up.   Return to the ER with any other new severe symptoms such as numbness, tingling, or weakness in your hand.

## 2020-12-16 ENCOUNTER — Encounter (HOSPITAL_COMMUNITY): Payer: Self-pay | Admitting: Emergency Medicine

## 2020-12-16 ENCOUNTER — Emergency Department (HOSPITAL_COMMUNITY)
Admission: EM | Admit: 2020-12-16 | Discharge: 2020-12-16 | Disposition: A | Discharge status: Home / Self Care | Payer: Medicaid Other | Attending: Emergency Medicine | Admitting: Emergency Medicine

## 2020-12-16 ENCOUNTER — Other Ambulatory Visit: Payer: Self-pay

## 2020-12-16 DIAGNOSIS — R112 Nausea with vomiting, unspecified: Secondary | ICD-10-CM | POA: Insufficient documentation

## 2020-12-16 DIAGNOSIS — R1030 Lower abdominal pain, unspecified: Secondary | ICD-10-CM

## 2020-12-16 DIAGNOSIS — R197 Diarrhea, unspecified: Secondary | ICD-10-CM | POA: Insufficient documentation

## 2020-12-16 DIAGNOSIS — R1032 Left lower quadrant pain: Secondary | ICD-10-CM | POA: Insufficient documentation

## 2020-12-16 DIAGNOSIS — R1031 Right lower quadrant pain: Secondary | ICD-10-CM | POA: Insufficient documentation

## 2020-12-16 DIAGNOSIS — J45909 Unspecified asthma, uncomplicated: Secondary | ICD-10-CM | POA: Insufficient documentation

## 2020-12-16 LAB — URINALYSIS, ROUTINE W REFLEX MICROSCOPIC
Bilirubin Urine: NEGATIVE
Glucose, UA: NEGATIVE mg/dL
Hgb urine dipstick: NEGATIVE
Ketones, ur: NEGATIVE mg/dL
Leukocytes,Ua: NEGATIVE
Nitrite: NEGATIVE
Protein, ur: NEGATIVE mg/dL
Specific Gravity, Urine: 1.019 (ref 1.005–1.030)
pH: 6 (ref 5.0–8.0)

## 2020-12-16 LAB — COMPREHENSIVE METABOLIC PANEL
ALT: 14 U/L (ref 0–44)
AST: 17 U/L (ref 15–41)
Albumin: 4 g/dL (ref 3.5–5.0)
Alkaline Phosphatase: 55 U/L (ref 38–126)
Anion gap: 6 (ref 5–15)
BUN: 9 mg/dL (ref 6–20)
CO2: 29 mmol/L (ref 22–32)
Calcium: 9.1 mg/dL (ref 8.9–10.3)
Chloride: 103 mmol/L (ref 98–111)
Creatinine, Ser: 0.89 mg/dL (ref 0.61–1.24)
GFR, Estimated: >60 mL/min (ref >60)
Glucose, Bld: 96 mg/dL (ref 70–99)
Potassium: 3.8 mmol/L (ref 3.5–5.1)
Sodium: 138 mmol/L (ref 135–145)
Total Bilirubin: 0.7 mg/dL (ref 0.3–1.2)
Total Protein: 7.4 g/dL (ref 6.5–8.1)

## 2020-12-16 LAB — CBC
HCT: 39.8 % (ref 39.0–52.0)
Hemoglobin: 13 g/dL (ref 13.0–17.0)
MCH: 31.9 pg (ref 26.0–34.0)
MCHC: 32.7 g/dL (ref 30.0–36.0)
MCV: 97.8 fL (ref 80.0–100.0)
Platelets: 257 10*3/uL (ref 150–400)
RBC: 4.07 MIL/uL — ABNORMAL LOW (ref 4.22–5.81)
RDW: 11.6 % (ref 11.5–15.5)
WBC: 6.6 10*3/uL (ref 4.0–10.5)
nRBC: 0 % (ref 0.0–0.2)

## 2020-12-16 LAB — LIPASE, BLOOD: Lipase: 24 U/L (ref 11–51)

## 2020-12-16 MED ORDER — ONDANSETRON 4 MG PO TBDP
8.0000 mg | ORAL_TABLET | Freq: Once | ORAL | Status: AC
  Administered 2020-12-16: 8 mg via ORAL
  Filled 2020-12-16: qty 2

## 2020-12-16 MED ORDER — ONDANSETRON 8 MG PO TBDP: 8.0000 mg | ORAL_TABLET | Freq: Three times a day (TID) | ORAL | 0 refills | Status: AC | PRN

## 2020-12-16 NOTE — ED Provider Notes (Signed)
Emergency Medicine Provider Triage Evaluation Note  Thomas Merritt , a 25 y.o. male  was evaluated in triage.  Pt complains of abdominal pain, vomiting, nausea x 4 days. Pain is RLQ and LLQ. Intermittent, becoming more frequent. Says it feels like when he had his appendix out last year.   Review of Systems  Positive: Nausea, vomiting, abdominal pain Negative: Dysuria, hematuria, diarrhea   Physical Exam  There were no vitals taken for this visit. Gen:   Awake, no distress  Resp:  Normal effort  MSK:   Moves extremities without difficulty  Other:  RLQ and LLQ tender, no guarding or rebound   Medical Decision Making  Medically screening exam initiated at 9:58 AM.  Appropriate orders placed.  Thomas Merritt was informed that the remainder of the evaluation will be completed by another provider, this initial triage assessment does not replace that evaluation, and the importance of remaining in the ED until their evaluation is complete.     Theron Arista, PA-C 12/16/20 1003    Arby Barrette, MD 12/23/20 2154

## 2020-12-16 NOTE — ED Provider Notes (Signed)
[facility] EMERGENCY DEPARTMENT Provider Note   CSN: [id number] Arrival date & time: 12/16/20  0623     History Chief Complaint  Patient presents with  . Abdominal Pain  . Emesis    Thomas Merritt is a 25 y.o. male.  HPI 25 year old male presents today complaining of 2 to 3 days of abdominal pain, nausea, vomiting, diarrhea.  He has been afebrile.  He reports that he has vomited twice today and had 2 loose bowel movements.  Emesis has been nonbloody and nonbilious.  There has not been any blood in his stool.  Pain is in bilateral lower quadrants but started in his right lower quadrant.  He states that this is similar to pain he had last year prior to an appendectomy.  He denies any other similar type episodes.  He has a history of some mild asthma but does not take medications daily.    Past Medical History:  Diagnosis Date  . Asthma     Patient Active Problem List   Diagnosis Date Noted  . Adjustment disorder with mixed anxiety and depressed mood 07/05/2019    Past Surgical History:  Procedure Laterality Date  . LAPAROSCOPIC APPENDECTOMY N/A 02/16/2020   Procedure: APPENDECTOMY LAPAROSCOPIC;  Surgeon: Gaynelle Adu, MD;  Location: [facility] OR;  Service: General;  Laterality: N/A;       Family History  Problem Relation Age of Onset  . Diabetes Father   . Hypertension Father   . Renal Disease Father   . Asthma Father     Social History   Tobacco Use  . Smoking status: Never Smoker  . Smokeless tobacco: Never Used  Vaping Use  . Vaping Use: Never used  Substance Use Topics  . Alcohol use: Yes    Comment: rarely  . Drug use: Never    Home Medications Prior to Admission medications   Medication Sig Start Date End Date Taking? Authorizing Provider  predniSONE (DELTASONE) 20 MG tablet Take 2 tablets (40 mg total) by mouth daily. 11/14/20   Benjiman Core, MD    Allergies    Patient has no known allergies.  Review of Systems   Review of  Systems  Gastrointestinal: Positive for abdominal pain, diarrhea, nausea and vomiting.  All other systems reviewed and are negative.   Physical Exam Updated Vital Signs BP 138/86 (BP Location: Left Arm)   Pulse 65   Temp 98.3 F (36.8 C)   Resp 14   SpO2 100%   Physical Exam Vitals and nursing note reviewed.  Constitutional:      Appearance: He is well-developed.  HENT:     Head: Normocephalic and atraumatic.     Right Ear: External ear normal.     Left Ear: External ear normal.     Nose: Nose normal.  Eyes:     Extraocular Movements: Extraocular movements intact.  Neck:     Trachea: No tracheal deviation.  Cardiovascular:     Rate and Rhythm: Normal rate and regular rhythm.     Heart sounds: Normal heart sounds.  Pulmonary:     Effort: Pulmonary effort is normal.  Abdominal:     General: Abdomen is flat. Bowel sounds are normal.     Palpations: Abdomen is soft.     Tenderness: There is abdominal tenderness in the right lower quadrant. There is no right CVA tenderness or guarding. Negative signs include Murphy's sign.  Musculoskeletal:        General: Normal range of motion.  Skin:    General: Skin is warm and dry.     Capillary Refill: Capillary refill takes less than 2 seconds.  Neurological:     General: No focal deficit present.     Mental Status: He is alert and oriented to person, place, and time.  Psychiatric:        Behavior: Behavior normal.     ED Results / Procedures / Treatments   Labs (all labs ordered are listed, but only abnormal results are displayed) Labs Reviewed  CBC - Abnormal; Notable for the following components:      Result Value   RBC 4.07 (*)    All other components within normal limits  LIPASE, BLOOD  COMPREHENSIVE METABOLIC PANEL  URINALYSIS, ROUTINE W REFLEX MICROSCOPIC  URINALYSIS, ROUTINE W REFLEX MICROSCOPIC    EKG None  Radiology No results found.  Procedures Procedures   Medications Ordered in ED Medications   ondansetron (ZOFRAN-ODT) disintegrating tablet 8 mg (has no administration in time range)    ED Course  I have reviewed the triage vital signs and the nursing notes.  Pertinent labs & imaging results that were available during my care of the patient were reviewed by me and considered in my medical decision making (see chart for details).    MDM Rules/Calculators/A&P                          82 male s/p appendectomy a year ago with some bilateral lower abdominal pain, n/v/d.  Labs normal.  Patient tolerating po well after zofran.  Discussed return precautions and need for follow up and voices understanding. Final Clinical Impression(s) / ED Diagnoses Final diagnoses:  Nausea vomiting and diarrhea  Lower abdominal pain    Rx / DC Orders ED Discharge Orders    None       Margarita Grizzle, MD 12/16/20 1305

## 2020-12-16 NOTE — ED Triage Notes (Signed)
Pt reports abdominal pain, n/v/d since Friday.

## 2020-12-18 ENCOUNTER — Other Ambulatory Visit: Payer: Self-pay

## 2020-12-18 ENCOUNTER — Ambulatory Visit
Admission: EM | Admit: 2020-12-18 | Discharge: 2020-12-18 | Disposition: A | Discharge status: Home / Self Care | Payer: Self-pay | Attending: Family Medicine | Admitting: Family Medicine

## 2020-12-18 DIAGNOSIS — Z113 Encounter for screening for infections with a predominantly sexual mode of transmission: Secondary | ICD-10-CM | POA: Insufficient documentation

## 2020-12-18 NOTE — ED Provider Notes (Signed)
[name]    CSN: [id number] Arrival date & time: 12/18/20  0810      History   Chief Complaint Chief Complaint  Patient presents with   STD testing    HPI Thomas Merritt is a 25 y.o. male.   Patient presenting today requesting STD screening as he found out several days ago that his girlfriend was having an affair with someone else.  He denies penile discharge, dysuria, hematuria, pelvic or abdominal pain, fevers, chills.  He is not aware that her new partner had any STIs but just wanted to be cautious.   Past Medical History:  Diagnosis Date   Asthma     Patient Active Problem List   Diagnosis Date Noted   Adjustment disorder with mixed anxiety and depressed mood 07/05/2019    Past Surgical History:  Procedure Laterality Date   LAPAROSCOPIC APPENDECTOMY N/A 02/16/2020   Procedure: APPENDECTOMY LAPAROSCOPIC;  Surgeon: Gaynelle Adu, MD;  Location: [facility] OR;  Service: General;  Laterality: N/A;       Home Medications    Prior to Admission medications   Medication Sig Start Date End Date Taking? Authorizing Provider  ondansetron (ZOFRAN ODT) 8 MG disintegrating tablet Take 1 tablet (8 mg total) by mouth every 8 (eight) hours as needed for nausea or vomiting. 12/16/20   Margarita Grizzle, MD  predniSONE (DELTASONE) 20 MG tablet Take 2 tablets (40 mg total) by mouth daily. 11/14/20   Benjiman Core, MD    Family History Family History  Problem Relation Age of Onset   Diabetes Father    Hypertension Father    Renal Disease Father    Asthma Father     Social History Social History   Tobacco Use   Smoking status: Never   Smokeless tobacco: Never  Vaping Use   Vaping Use: Never used  Substance Use Topics   Alcohol use: Yes    Comment: rarely   Drug use: Never     Allergies   Patient has no known allergies.   Review of Systems Review of Systems Per HPI  Physical Exam Triage Vital Signs ED Triage Vitals  Enc Vitals Group     BP  12/18/20 0826 138/78     Pulse Rate 12/18/20 0826 71     Resp 12/18/20 0826 18     Temp 12/18/20 0826 98.1 F (36.7 C)     Temp Source 12/18/20 0826 Oral     SpO2 12/18/20 0826 98 %     Weight --      Height --      Head Circumference --      Peak Flow --      Pain Score 12/18/20 0827 0     Pain Loc --      Pain Edu? --      Excl. in GC? --    No data found.  Updated Vital Signs BP 138/78 (BP Location: Left Arm)   Pulse 71   Temp 98.1 F (36.7 C) (Oral)   Resp 18   SpO2 98%   Visual Acuity Right Eye Distance:   Left Eye Distance:   Bilateral Distance:    Right Eye Near:   Left Eye Near:    Bilateral Near:     Physical Exam Vitals and nursing note reviewed.  Constitutional:      Appearance: Normal appearance.  HENT:     Head: Atraumatic.  Eyes:     Extraocular Movements: Extraocular movements intact.  Conjunctiva/sclera: Conjunctivae normal.  Cardiovascular:     Rate and Rhythm: Normal rate and regular rhythm.     Heart sounds: Normal heart sounds.  Pulmonary:     Effort: Pulmonary effort is normal.     Breath sounds: Normal breath sounds.  Abdominal:     General: Bowel sounds are normal. There is no distension.     Palpations: Abdomen is soft.     Tenderness: There is no abdominal tenderness. There is no guarding.  Genitourinary:    Comments: GU exam deferred, self swab performed Musculoskeletal:        General: Normal range of motion.     Cervical back: Normal range of motion and neck supple.  Skin:    General: Skin is warm and dry.  Neurological:     General: No focal deficit present.     Mental Status: He is oriented to person, place, and time.  Psychiatric:        Mood and Affect: Mood normal.        Thought Content: Thought content normal.        Judgment: Judgment normal.   UC Treatments / Results  Labs (all labs ordered are listed, but only abnormal results are displayed) Labs Reviewed  RPR  HIV ANTIBODY (ROUTINE TESTING W REFLEX)   CYTOLOGY, (ORAL, ANAL, URETHRAL) ANCILLARY ONLY    EKG   Radiology No results found.  Procedures Procedures (including critical care time)  Medications Ordered in UC Medications - No data to display  Initial Impression / Assessment and Plan / UC Course  I have reviewed the triage vital signs and the nursing notes.  Pertinent labs & imaging results that were available during my care of the patient were reviewed by me and considered in my medical decision making (see chart for details).     Cytology swab and HIV, syphilis labs pending.  Discussed abstinence until results return and we will treat based on these results.  Safe sexual practices reviewed.  Final Clinical Impressions(s) / UC Diagnoses   Final diagnoses:  Routine screening for STI (sexually transmitted infection)   Discharge Instructions   None    ED Prescriptions   None    PDMP not reviewed this encounter.   Roosvelt Maser [place], [place] 12/18/20 [phone]

## 2020-12-18 NOTE — ED Triage Notes (Signed)
Pt is requesting STD testing. Denies any known exposures. No sxs.

## 2020-12-19 LAB — CYTOLOGY, (ORAL, ANAL, URETHRAL) ANCILLARY ONLY
Chlamydia: NEGATIVE
Comment: NEGATIVE
Comment: NEGATIVE
Comment: NORMAL
Neisseria Gonorrhea: NEGATIVE
Trichomonas: NEGATIVE

## 2020-12-19 LAB — RPR: RPR Ser Ql: NONREACTIVE

## 2020-12-19 LAB — HIV ANTIBODY (ROUTINE TESTING W REFLEX): HIV Screen 4th Generation wRfx: NONREACTIVE

## 2020-12-20 ENCOUNTER — Encounter (HOSPITAL_COMMUNITY): Payer: Self-pay | Admitting: Emergency Medicine

## 2020-12-20 ENCOUNTER — Emergency Department (HOSPITAL_COMMUNITY): Payer: Self-pay

## 2020-12-20 ENCOUNTER — Emergency Department (HOSPITAL_COMMUNITY)
Admission: EM | Admit: 2020-12-20 | Discharge: 2020-12-20 | Disposition: A | Discharge status: Home / Self Care | Payer: Self-pay | Attending: Emergency Medicine | Admitting: Emergency Medicine

## 2020-12-20 DIAGNOSIS — J45909 Unspecified asthma, uncomplicated: Secondary | ICD-10-CM | POA: Insufficient documentation

## 2020-12-20 DIAGNOSIS — R112 Nausea with vomiting, unspecified: Secondary | ICD-10-CM | POA: Insufficient documentation

## 2020-12-20 DIAGNOSIS — R1031 Right lower quadrant pain: Secondary | ICD-10-CM | POA: Insufficient documentation

## 2020-12-20 DIAGNOSIS — R197 Diarrhea, unspecified: Secondary | ICD-10-CM | POA: Insufficient documentation

## 2020-12-20 LAB — COMPREHENSIVE METABOLIC PANEL
ALT: 16 U/L (ref 0–44)
AST: 20 U/L (ref 15–41)
Albumin: 4.5 g/dL (ref 3.5–5.0)
Alkaline Phosphatase: 61 U/L (ref 38–126)
Anion gap: 8 (ref 5–15)
BUN: 13 mg/dL (ref 6–20)
CO2: 29 mmol/L (ref 22–32)
Calcium: 9.8 mg/dL (ref 8.9–10.3)
Chloride: 104 mmol/L (ref 98–111)
Creatinine, Ser: 1.02 mg/dL (ref 0.61–1.24)
GFR, Estimated: >60 mL/min (ref >60)
Glucose, Bld: 98 mg/dL (ref 70–99)
Potassium: 4 mmol/L (ref 3.5–5.1)
Sodium: 141 mmol/L (ref 135–145)
Total Bilirubin: 1.3 mg/dL — ABNORMAL HIGH (ref 0.3–1.2)
Total Protein: 8.1 g/dL (ref 6.5–8.1)

## 2020-12-20 LAB — CBC WITH DIFFERENTIAL/PLATELET
Abs Immature Granulocytes: 0.01 10*3/uL (ref 0.00–0.07)
Basophils Absolute: 0.1 10*3/uL (ref 0.0–0.1)
Basophils Relative: 1 %
Eosinophils Absolute: 0.1 10*3/uL (ref 0.0–0.5)
Eosinophils Relative: 1 %
HCT: 43 % (ref 39.0–52.0)
Hemoglobin: 13.9 g/dL (ref 13.0–17.0)
Immature Granulocytes: 0 %
Lymphocytes Relative: 37 %
Lymphs Abs: 2.7 10*3/uL (ref 0.7–4.0)
MCH: 31.5 pg (ref 26.0–34.0)
MCHC: 32.3 g/dL (ref 30.0–36.0)
MCV: 97.5 fL (ref 80.0–100.0)
Monocytes Absolute: 0.6 10*3/uL (ref 0.1–1.0)
Monocytes Relative: 9 %
Neutro Abs: 3.9 10*3/uL (ref 1.7–7.7)
Neutrophils Relative %: 52 %
Platelets: 279 10*3/uL (ref 150–400)
RBC: 4.41 MIL/uL (ref 4.22–5.81)
RDW: 11.8 % (ref 11.5–15.5)
WBC: 7.4 10*3/uL (ref 4.0–10.5)
nRBC: 0 % (ref 0.0–0.2)

## 2020-12-20 LAB — URINALYSIS, ROUTINE W REFLEX MICROSCOPIC
Bacteria, UA: NONE SEEN
Bilirubin Urine: NEGATIVE
Glucose, UA: NEGATIVE mg/dL
Hgb urine dipstick: NEGATIVE
Ketones, ur: 5 mg/dL — AB
Leukocytes,Ua: NEGATIVE
Nitrite: NEGATIVE
Protein, ur: 30 mg/dL — AB
Specific Gravity, Urine: 1.034 — ABNORMAL HIGH (ref 1.005–1.030)
pH: 5 (ref 5.0–8.0)

## 2020-12-20 LAB — LIPASE, BLOOD: Lipase: 30 U/L (ref 11–51)

## 2020-12-20 MED ORDER — LACTATED RINGERS IV BOLUS
1000.0000 mL | Freq: Once | INTRAVENOUS | Status: AC
  Administered 2020-12-20: 1000 mL via INTRAVENOUS

## 2020-12-20 MED ORDER — IOHEXOL 300 MG/ML  SOLN
76.0000 mL | Freq: Once | INTRAMUSCULAR | Status: AC | PRN
  Administered 2020-12-20: 76 mL via INTRAVENOUS

## 2020-12-20 MED ORDER — PROMETHAZINE HCL 12.5 MG PO TABS: 12.5000 mg | ORAL_TABLET | Freq: Four times a day (QID) | ORAL | 0 refills | Status: AC | PRN

## 2020-12-20 MED ORDER — ONDANSETRON 4 MG PO TBDP: 8.0000 mg | ORAL_TABLET | Freq: Once | ORAL | Status: DC

## 2020-12-20 MED ORDER — ONDANSETRON HCL 4 MG/2ML IJ SOLN
4.0000 mg | Freq: Once | INTRAMUSCULAR | Status: AC
  Administered 2020-12-20: 4 mg via INTRAVENOUS
  Filled 2020-12-20: qty 2

## 2020-12-20 MED ORDER — ONDANSETRON HCL 4 MG/2ML IJ SOLN: 4.0000 mg | Freq: Once | INTRAMUSCULAR | Status: DC

## 2020-12-20 NOTE — ED Provider Notes (Signed)
[facility] EMERGENCY DEPARTMENT Provider Note   CSN: [id number] Arrival date & time: 12/20/20  1317     History Chief Complaint  Patient presents with   Abdominal Pain    Thomas Merritt is a 25 y.o. male with past medical history of appendectomy in 2021 presents for nausea, vomiting, and diarrhea for the last week. Was evaluated for the same symptoms on 12/16/2020 and treated with Zofran with improvement. States RLQ pain is worsening. Describes it as intermittent sharp pain worse with BM, vomiting, and urination. Has not been tolerating much PO intake since symptoms started. Has 1-2 episodes of emesis and diarrhea daily. Notes small amount of bright red blood with vomiting for the past 2 days. Denies fever, chest pain, sob, bloating, dysuria, hematochezia. Feels pain is similar to when when had appendicitis last year. Has been taking ibuprofen and pepto-bismol at home without improvement. Denies trauma or sick contacts.  Past Medical History:  Diagnosis Date   Asthma     Patient Active Problem List   Diagnosis Date Noted   Adjustment disorder with mixed anxiety and depressed mood 07/05/2019    Past Surgical History:  Procedure Laterality Date   LAPAROSCOPIC APPENDECTOMY N/A 02/16/2020   Procedure: APPENDECTOMY LAPAROSCOPIC;  Surgeon: Gaynelle Adu, MD;  Location: [facility] OR;  Service: General;  Laterality: N/A;      Family History  Problem Relation Age of Onset   Diabetes Father    Hypertension Father    Renal Disease Father    Asthma Father     Social History   Tobacco Use   Smoking status: Never   Smokeless tobacco: Never  Vaping Use   Vaping Use: Never used  Substance Use Topics   Alcohol use: Yes    Comment: rarely   Drug use: Never    Home Medications Prior to Admission medications   Medication Sig Start Date End Date Taking? Authorizing Provider  promethazine (PHENERGAN) 12.5 MG tablet Take 1 tablet (12.5 mg total) by mouth every 6 (six)  hours as needed for up to 5 days for nausea or vomiting. 12/20/20 12/25/20 Yes Quincy Simmonds, MD  ondansetron (ZOFRAN ODT) 8 MG disintegrating tablet Take 1 tablet (8 mg total) by mouth every 8 (eight) hours as needed for nausea or vomiting. Patient not taking: Reported on 12/20/2020 12/16/20   Margarita Grizzle, MD  predniSONE (DELTASONE) 20 MG tablet Take 2 tablets (40 mg total) by mouth daily. Patient not taking: Reported on 12/20/2020 11/14/20   Benjiman Core, MD    Allergies    Patient has no known allergies.  Review of Systems   Review of Systems  Constitutional:  Positive for appetite change and fatigue. Negative for fever.  Gastrointestinal:  Positive for abdominal pain, diarrhea, nausea and vomiting. Negative for abdominal distention.  Genitourinary:  Negative for dysuria and flank pain.  Musculoskeletal:  Negative for back pain.  All other systems reviewed and are negative.  Physical Exam Updated Vital Signs BP 136/69   Pulse 61   Temp 98.8 F (37.1 C) (Oral)   Resp 14   Ht 5' 9.5" (1.765 m)   Wt 79.4 kg   SpO2 100%   BMI 25.47 kg/m   Physical Exam Constitutional:      Appearance: He is well-developed.  HENT:     Head: Normocephalic and atraumatic.     Mouth/Throat:     Mouth: Mucous membranes are moist.     Pharynx: Oropharynx is clear.  Eyes:  Extraocular Movements: Extraocular movements intact.     Pupils: Pupils are equal, round, and reactive to light.  Cardiovascular:     Rate and Rhythm: Normal rate and regular rhythm.  Pulmonary:     Effort: Pulmonary effort is normal.     Breath sounds: Normal breath sounds. No wheezing.  Abdominal:     General: Abdomen is flat. Bowel sounds are normal. There is no distension.     Palpations: Abdomen is soft.     Tenderness: There is abdominal tenderness in the right lower quadrant. There is no right CVA tenderness, left CVA tenderness, guarding or rebound.  Genitourinary:    Testes:        Right: Swelling not  present.        Left: Swelling not present.  Skin:    General: Skin is warm and dry.     Capillary Refill: Capillary refill takes less than 2 seconds.  Neurological:     General: No focal deficit present.     Mental Status: He is alert. He is disoriented.  Psychiatric:        Mood and Affect: Mood normal.        Behavior: Behavior normal.    ED Results / Procedures / Treatments   Labs (all labs ordered are listed, but only abnormal results are displayed) Labs Reviewed  COMPREHENSIVE METABOLIC PANEL - Abnormal; Notable for the following components:      Result Value   Total Bilirubin 1.3 (*)    All other components within normal limits  URINALYSIS, ROUTINE W REFLEX MICROSCOPIC - Abnormal; Notable for the following components:   Color, Urine AMBER (*)    APPearance TURBID (*)    Specific Gravity, Urine 1.034 (*)    Ketones, ur 5 (*)    Protein, ur 30 (*)    All other components within normal limits  CBC WITH DIFFERENTIAL/PLATELET  LIPASE, BLOOD    EKG None  Radiology CT Abdomen Pelvis W Contrast  Result Date: 12/20/2020 CLINICAL DATA:  Right lower quadrant pain with nausea and vomiting. EXAM: CT ABDOMEN AND PELVIS WITH CONTRAST TECHNIQUE: Multidetector CT imaging of the abdomen and pelvis was performed using the standard protocol following bolus administration of intravenous contrast. CONTRAST:  55mL OMNIPAQUE IOHEXOL 300 MG/ML  SOLN COMPARISON:  February 16, 2020 FINDINGS: Lower chest: No acute abnormality. Hepatobiliary: No focal liver abnormality is seen. No gallstones, gallbladder wall thickening, or biliary dilatation. Pancreas: Unremarkable. No pancreatic ductal dilatation or surrounding inflammatory changes. Spleen: Normal in size without focal abnormality. Adrenals/Urinary Tract: Adrenal glands are unremarkable. Kidneys are normal, without renal calculi, focal lesion, or hydronephrosis. Bladder is unremarkable. Stomach/Bowel: The stomach and small bowel are normal. The colon  is unremarkable other than fecal loading in the ascending colon. The patient is status post appendectomy. A few tiny lymph nodes are seen adjacent to the cecum such as on series 3, image 57. A coronal images 35 and 36. Vascular/Lymphatic: No significant vascular findings are present. No enlarged abdominal or pelvic lymph nodes. Reproductive: Prostate is unremarkable. Other: No abdominal wall hernia or abnormality. No abdominopelvic ascites. Musculoskeletal: No acute or significant osseous findings. IMPRESSION: 1. Mild fecal loading in the ascending colon. 2. A few tiny lymph nodes adjacent to the cecum are nonspecific. They may be normal or mildly reactive. 3. The patient is status post appendectomy. 4. No other abnormalities. Electronically Signed   By: Gerome Sam III M.D   On: 12/20/2020 18:43     Medications Ordered in  ED Medications  lactated ringers bolus 1,000 mL (0 mLs Intravenous Stopped 12/20/20 1858)  ondansetron (ZOFRAN) injection 4 mg (4 mg Intravenous Given 12/20/20 1709)  iohexol (OMNIPAQUE) 300 MG/ML solution 76 mL (76 mLs Intravenous Contrast Given 12/20/20 1829)    ED Course  I have reviewed the triage vital signs and the nursing notes.  Pertinent labs & imaging results that were available during my care of the patient were reviewed by me and considered in my medical decision making (see chart for details).    MDM Rules/Calculators/A&P                          25 year old male with prior appendectomy presents for continued nausea, vomiting, and RLQ abdominal pain. Appear uncomfortable, mild RLQ tenderness on exam, no peritoneal signs. Vitals stable.CBC, CMP, and lipase unremarkable. CT abdomen with fecal loading of the ascending colon. Treated with zofran and IVF with improvement in symptoms. Will try sending home with phenergan as it may be more affordable for patient. Return precautions given.  Final Clinical Impression(s) / ED Diagnoses Final diagnoses:  Diarrhea,  unspecified type    Rx / DC Orders ED Discharge Orders          Ordered    promethazine (PHENERGAN) 12.5 MG tablet  Every 6 hours PRN        12/20/20 1853             Quincy Simmonds, MD 12/20/20 Bennie Hind    Jacalyn Lefevre, MD 12/20/20 2002

## 2020-12-20 NOTE — ED Notes (Signed)
Pt verbalizes understanding of discharge instructions. Opportunity for questions and answers were provided. Pt discharged from the ED.   ?

## 2020-12-20 NOTE — Discharge Instructions (Addendum)
Mr. Reisz you were evaluated for nausea, vomiting and diarrhea, and treated with anti nausea medicine and fluids. You lab work was normal. The Ct scan of you abdomen showed some increase stool in the intestines. Please try to increase you fiber intake to help with this. Please make sure you are drinking plenty of fluids. Take phenergan as needed for nausea. Please return if symptoms are worsening, fever, inability to eat or drink.

## 2020-12-20 NOTE — ED Provider Notes (Signed)
Emergency Medicine Provider Triage Evaluation Note  Thomas Merritt , a 25 y.o. male  was evaluated in triage.  Pt complains of lower abdominal pain, nausea, vomiting, diarrhea starting Tuesday. H/o appendectomy. Was seen for this in ED told to come back if worsening.   Review of Systems  Positive: Nausea vomiting abd pain diarrhea weakness Negative: fever  Physical Exam  BP 113/76   Pulse 64   Temp 98.8 F (37.1 C) (Oral)   Resp 16   Ht 5' 9.5" (1.765 m)   Wt 79.4 kg   SpO2 100%   BMI 25.47 kg/m  Gen:   Awake, no distress   Resp:  Normal effort  MSK:   Moves extremities without difficulty  Abd:  Diffuse lower abd tendernress, no CVAT  Medical Decision Making  Medically screening exam initiated at 2:01 PM.  Appropriate orders placed.    Patient made aware this encounter is a triage and screening encounter and no beds are immediately available at this time in the ER.  Patient was informed that the remainder of the evaluation will be completed by another provider.  Patient made aware triage orders have been placed and patient will be placed in the waiting room while work up is initiated and until a room becomes available. Patient encouraged to await a formal ER encounter with a clinician.  Patient made aware that exiting the department prior to formal encounter with an ER clinician and completion of the work-up is considered leaving against medical advice.  At that time there is no guarantee that there are no emergency medical conditions present and patient assumes risks of leaving including worsening condition, permanent disability and death. Patient verbalizes understanding.     Liberty Handy, PA-C 12/20/20 1402    Benjiman Core, MD 12/20/20 [phone]

## 2020-12-20 NOTE — ED Triage Notes (Signed)
Pt endorses bilateral lower abd pain, n/v.

## 2021-02-04 ENCOUNTER — Other Ambulatory Visit: Payer: Self-pay

## 2021-02-04 ENCOUNTER — Emergency Department (HOSPITAL_COMMUNITY)
Admission: EM | Admit: 2021-02-04 | Discharge: 2021-02-04 | Disposition: A | Discharge status: Home / Self Care | Payer: Self-pay | Attending: Emergency Medicine | Admitting: Emergency Medicine

## 2021-02-04 ENCOUNTER — Encounter (HOSPITAL_COMMUNITY): Payer: Self-pay | Admitting: Emergency Medicine

## 2021-02-04 ENCOUNTER — Emergency Department (HOSPITAL_COMMUNITY): Payer: Self-pay

## 2021-02-04 DIAGNOSIS — J45909 Unspecified asthma, uncomplicated: Secondary | ICD-10-CM | POA: Insufficient documentation

## 2021-02-04 DIAGNOSIS — R1110 Vomiting, unspecified: Secondary | ICD-10-CM

## 2021-02-04 DIAGNOSIS — R112 Nausea with vomiting, unspecified: Secondary | ICD-10-CM | POA: Insufficient documentation

## 2021-02-04 DIAGNOSIS — K8066 Calculus of gallbladder and bile duct with acute and chronic cholecystitis without obstruction: Secondary | ICD-10-CM | POA: Insufficient documentation

## 2021-02-04 DIAGNOSIS — R509 Fever, unspecified: Secondary | ICD-10-CM | POA: Insufficient documentation

## 2021-02-04 LAB — URINALYSIS, ROUTINE W REFLEX MICROSCOPIC
Glucose, UA: NEGATIVE mg/dL
Hgb urine dipstick: NEGATIVE
Ketones, ur: 40 mg/dL — AB
Leukocytes,Ua: NEGATIVE
Nitrite: NEGATIVE
Protein, ur: NEGATIVE mg/dL
Specific Gravity, Urine: >1.03 — ABNORMAL HIGH (ref 1.005–1.030)
pH: 6 (ref 5.0–8.0)

## 2021-02-04 LAB — CBC
HCT: 42.1 % (ref 39.0–52.0)
Hemoglobin: 13.4 g/dL (ref 13.0–17.0)
MCH: 30.9 pg (ref 26.0–34.0)
MCHC: 31.8 g/dL (ref 30.0–36.0)
MCV: 97 fL (ref 80.0–100.0)
Platelets: 268 10*3/uL (ref 150–400)
RBC: 4.34 MIL/uL (ref 4.22–5.81)
RDW: 11.8 % (ref 11.5–15.5)
WBC: 7.5 10*3/uL (ref 4.0–10.5)
nRBC: 0 % (ref 0.0–0.2)

## 2021-02-04 LAB — COMPREHENSIVE METABOLIC PANEL
ALT: 15 U/L (ref 0–44)
AST: 19 U/L (ref 15–41)
Albumin: 4.4 g/dL (ref 3.5–5.0)
Alkaline Phosphatase: 57 U/L (ref 38–126)
Anion gap: 9 (ref 5–15)
BUN: 11 mg/dL (ref 6–20)
CO2: 26 mmol/L (ref 22–32)
Calcium: 9.7 mg/dL (ref 8.9–10.3)
Chloride: 104 mmol/L (ref 98–111)
Creatinine, Ser: 0.98 mg/dL (ref 0.61–1.24)
GFR, Estimated: >60 mL/min (ref >60)
Glucose, Bld: 96 mg/dL (ref 70–99)
Potassium: 3.6 mmol/L (ref 3.5–5.1)
Sodium: 139 mmol/L (ref 135–145)
Total Bilirubin: 1.5 mg/dL — ABNORMAL HIGH (ref 0.3–1.2)
Total Protein: 7.8 g/dL (ref 6.5–8.1)

## 2021-02-04 LAB — LIPASE, BLOOD: Lipase: 24 U/L (ref 11–51)

## 2021-02-04 MED ORDER — SODIUM CHLORIDE 0.9 % IV BOLUS
1000.0000 mL | Freq: Once | INTRAVENOUS | Status: AC
Start: 2021-02-04 — End: 2021-02-04
  Administered 2021-02-04: 1000 mL via INTRAVENOUS

## 2021-02-04 MED ORDER — OMEPRAZOLE 20 MG PO CPDR: 20.0000 mg | DELAYED_RELEASE_CAPSULE | Freq: Every day | ORAL | 2 refills | Status: AC

## 2021-02-04 MED ORDER — ALUM & MAG HYDROXIDE-SIMETH 200-200-20 MG/5ML PO SUSP
30.0000 mL | Freq: Once | ORAL | Status: AC
  Administered 2021-02-04: 30 mL via ORAL
  Filled 2021-02-04: qty 30

## 2021-02-04 MED ORDER — ONDANSETRON HCL 4 MG PO TABS: 4.0000 mg | ORAL_TABLET | Freq: Four times a day (QID) | ORAL | 0 refills | Status: AC

## 2021-02-04 MED ORDER — ONDANSETRON HCL 4 MG/2ML IJ SOLN
4.0000 mg | Freq: Once | INTRAMUSCULAR | Status: AC
  Administered 2021-02-04: 4 mg via INTRAVENOUS
  Filled 2021-02-04: qty 2

## 2021-02-04 NOTE — ED Triage Notes (Signed)
Pt endorses vomiting since last seen here in June. States he vomits every morning and night. Denies abd pain.

## 2021-02-04 NOTE — ED Provider Notes (Signed)
[facility] EMERGENCY DEPARTMENT Provider Note   CSN: [id number] Arrival date & time: 02/04/21  4503     History Chief Complaint  Patient presents with   Emesis    Thomas Merritt is a 25 y.o. male.   Emesis Associated symptoms: abdominal pain and fever   Associated symptoms: no chills     Patient presents with nausea and emesis x2 months.  Reports daily episodes of emesis when he first wakes up and before he goes to bed.  Emesis seems to be brought on by laying fat flat, but it can happen randomly throughout the day or after eating.  He reports occasional episodes of hematemesis recently including streaking blood.  There is associated nausea and right lower quadrant pain occasionally that comes and goes.  He denies any changes to his diet, does not take daily medications, has tried Zofran and Phenergan with minimal relief. He does not eat spicy foods or fatty foods, no history of GERD that he knows of.  He does not smoke marijuana.  No previous abdominal surgeries other than appendectomy.    Past Medical History:  Diagnosis Date   Asthma     Patient Active Problem List   Diagnosis Date Noted   Adjustment disorder with mixed anxiety and depressed mood 07/05/2019    Past Surgical History:  Procedure Laterality Date   LAPAROSCOPIC APPENDECTOMY N/A 02/16/2020   Procedure: APPENDECTOMY LAPAROSCOPIC;  Surgeon: Gaynelle Adu, MD;  Location: [facility] OR;  Service: General;  Laterality: N/A;       Family History  Problem Relation Age of Onset   Diabetes Father    Hypertension Father    Renal Disease Father    Asthma Father     Social History   Tobacco Use   Smoking status: Never   Smokeless tobacco: Never  Vaping Use   Vaping Use: Never used  Substance Use Topics   Alcohol use: Yes    Comment: rarely   Drug use: Never    Home Medications Prior to Admission medications   Medication Sig Start Date End Date Taking? Authorizing Provider  ondansetron  (ZOFRAN ODT) 8 MG disintegrating tablet Take 1 tablet (8 mg total) by mouth every 8 (eight) hours as needed for nausea or vomiting. Patient not taking: Reported on 12/20/2020 12/16/20   Margarita Grizzle, MD  predniSONE (DELTASONE) 20 MG tablet Take 2 tablets (40 mg total) by mouth daily. Patient not taking: Reported on 12/20/2020 11/14/20   Benjiman Core, MD  promethazine (PHENERGAN) 12.5 MG tablet Take 1 tablet (12.5 mg total) by mouth every 6 (six) hours as needed for up to 5 days for nausea or vomiting. 12/20/20 12/25/20  Quincy Simmonds, MD    Allergies    Patient has no known allergies.  Review of Systems   Review of Systems  Constitutional:  Positive for fever. Negative for chills.  Gastrointestinal:  Positive for abdominal pain, nausea and vomiting.  Genitourinary:  Negative for dysuria and frequency.   Physical Exam Updated Vital Signs BP 135/83   Pulse 74   Temp 98.4 F (36.9 C) (Oral)   Resp 16   Ht 5\' 9"  (1.753 m)   Wt 77.1 kg   SpO2 97%   BMI 25.10 kg/m   Physical Exam Vitals and nursing note reviewed. Exam conducted with a chaperone present.  Constitutional:      Appearance: Normal appearance. He is well-developed.     Comments: Patient is resting comfortably, not in any acute distress  HENT:     Head: Normocephalic and atraumatic.  Eyes:     General: No scleral icterus.       Right eye: No discharge.        Left eye: No discharge.     Extraocular Movements: Extraocular movements intact.     Conjunctiva/sclera: Conjunctivae normal.     Pupils: Pupils are equal, round, and reactive to light.  Cardiovascular:     Rate and Rhythm: Normal rate and regular rhythm.     Pulses: Normal pulses.     Heart sounds: Normal heart sounds. No murmur heard.   No friction rub. No gallop.  Pulmonary:     Effort: Pulmonary effort is normal. No respiratory distress.     Breath sounds: Normal breath sounds.  Abdominal:     General: Abdomen is flat. Bowel sounds are normal. There is  no distension.     Palpations: Abdomen is soft.     Tenderness: There is abdominal tenderness. There is no right CVA tenderness or left CVA tenderness.     Comments: Abdomen is soft and not distended.  No guarding or rigidity or peritoneal signs.  Some mild right lower quadrant tenderness.  Musculoskeletal:     Cervical back: Neck supple.  Skin:    General: Skin is warm and dry.     Coloration: Skin is not jaundiced.  Neurological:     Mental Status: He is alert. Mental status is at baseline.     Coordination: Coordination normal.    ED Results / Procedures / Treatments   Labs (all labs ordered are listed, but only abnormal results are displayed) Labs Reviewed  COMPREHENSIVE METABOLIC PANEL - Abnormal; Notable for the following components:      Result Value   Total Bilirubin 1.5 (*)    All other components within normal limits  URINALYSIS, ROUTINE W REFLEX MICROSCOPIC - Abnormal; Notable for the following components:   Specific Gravity, Urine >1.030 (*)    Bilirubin Urine SMALL (*)    Ketones, ur 40 (*)    All other components within normal limits  LIPASE, BLOOD  CBC    EKG None  Radiology No results found.  Procedures Procedures   Medications Ordered in ED Medications  sodium chloride 0.9 % bolus 1,000 mL (has no administration in time range)  alum & mag hydroxide-simeth (MAALOX/MYLANTA) 200-200-20 MG/5ML suspension 30 mL (has no administration in time range)    ED Course  I have reviewed the triage vital signs and the nursing notes.  Pertinent labs & imaging results that were available during my care of the patient were reviewed by me and considered in my medical decision making (see chart for details).  Clinical Course as of 02/04/21 1115  Wed Feb 04, 2021  0917 CBC No leukocytosis concerning for infectious or inflammatory etiology, no anemia concerning for blood loss [HS]  0917 Urinalysis, Routine w reflex microscopic(!) Patient appears dehydrated, but no  signs of urinary tract infection [HS]  0917 Comprehensive metabolic panel(!) No electrolyte derangement from excessive emesis, no AKI, no elevated AST and ALT concerning for hepatitis or biliary obstruction. [HS]  0951 Lipase, blood Not consistent with pancreatitis [HS]    Clinical Course User Index [HS] Theron Arista, PA-C   MDM Rules/Calculators/A&P                           Patient resting comfortably, stable vitals and nontoxic-appearing.  Was seen 12/20/20 with CT scan with  contrast ordered.  That imaging was reviewed, no signs of emergent pathology needing follow-up CT scan.  I will check an ultrasound to evaluate for biliary colic.  I have high suspicion this could be due to reflux disease, we will try giving GI cocktail and fluids for now given he appears dehydrated.  Will reassess.  Since.  However, work-up was reassuring emergent disease.  Discussed following up with gastroenterology, patient verbalized agreement.  We will try a trial of Prilosec to see if this helps improve his symptoms.  Appropriate for discharge at this time.  Final Clinical Impression(s) / ED Diagnoses Final diagnoses:  None    Rx / DC Orders ED Discharge Orders     None        Theron Arista, Cordelia Poche 02/04/21 1116    Blane Ohara, MD 02/05/21 1737

## 2021-02-04 NOTE — ED Notes (Signed)
Sent to ultrasound

## 2021-02-04 NOTE — Discharge Instructions (Addendum)
Take Prilosec 30 minutes before your first meal of the day in the morning.  I recommend taking it after using dissolvable Zofran to prevent any nausea and vomiting.   Please do this for a minimum of 4 weeks.  It might take about a week to start noticing improvement. Please follow-up with your primary care doctor in the next 4 weeks for reeval.  I do not see a primary care doctor listed so please call the wellness center to establish care.  Contact information is listed here. Please call on of the GI groups and set up an appointment for follow up evaluation. You may require further workup including an endoscopy. If conditions change or worsen return to the ED for further evaluation.

## 2021-11-19 IMAGING — CT CT ABD-PELV W/ CM
2 of 4 series · 16 of 46 positions shown, 18 images · IV contrast (APPLIED)
Comparison: None.

CLINICAL DATA: Lower abdominal pain, nausea/vomiting

EXAM:
CT ABDOMEN AND PELVIS WITH CONTRAST
TECHNIQUE: Multidetector CT imaging of the abdomen and pelvis was performed
using the standard protocol following bolus administration of
intravenous contrast.
CONTRAST:  100mL OMNIPAQUE IOHEXOL 300 MG/ML  SOLN

[Series 3: abd/ pelvis 5.0 i30f 2 · axial · 0.83mm/px · z∈[+636,+1106]mm · 13 of 104 slices shown, 15 images]
[im 5/104  soft-tissue]
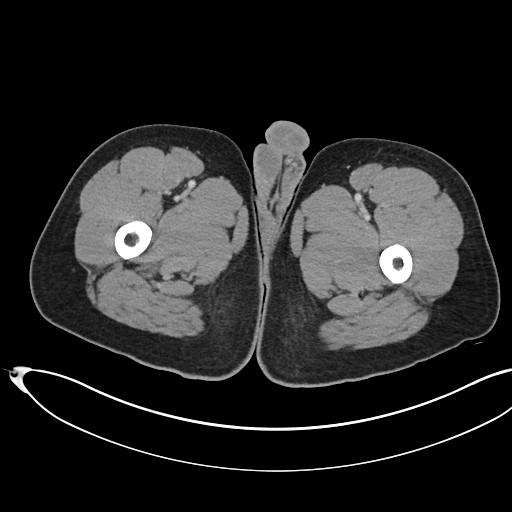
[im 5/104  bone]
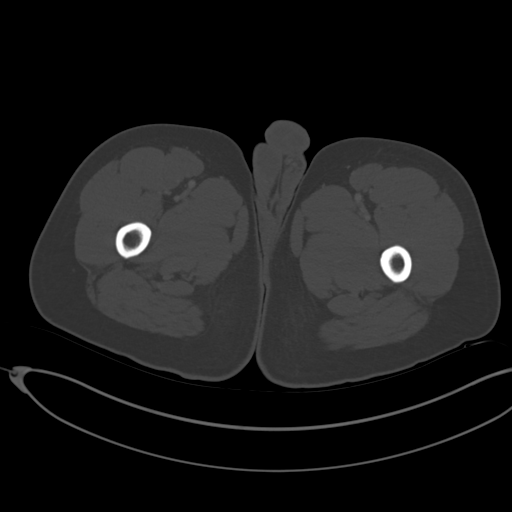
[im 13/104  soft-tissue]
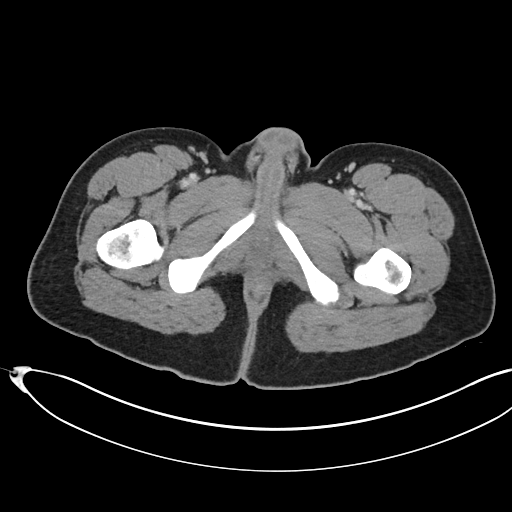
[im 22/104  soft-tissue]
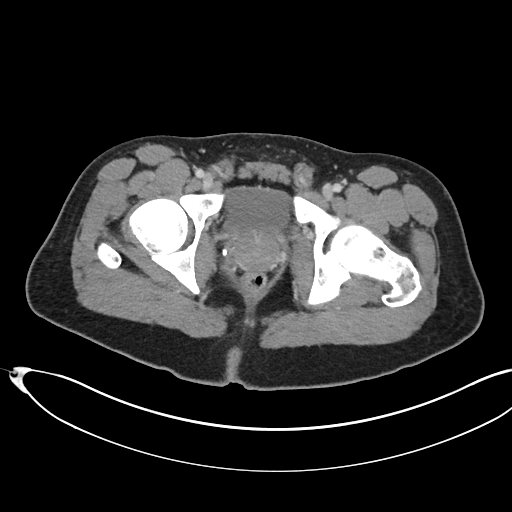
[im 31/104  soft-tissue]
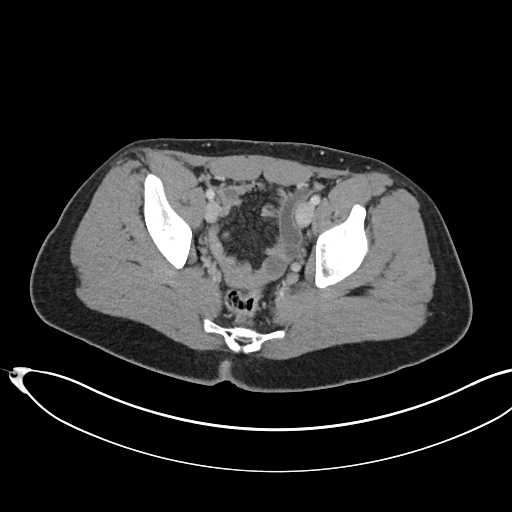
[im 35/104  soft-tissue]
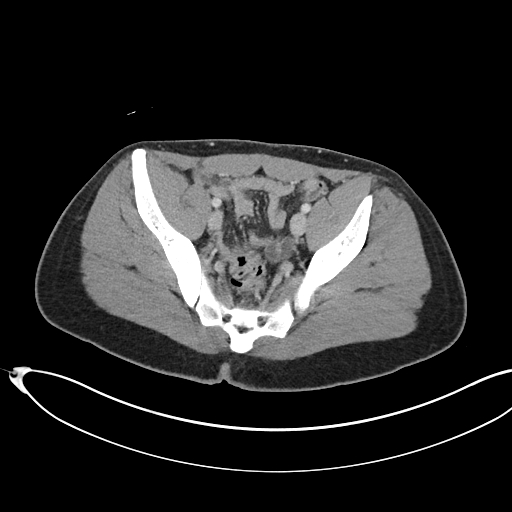
[im 43/104  soft-tissue]
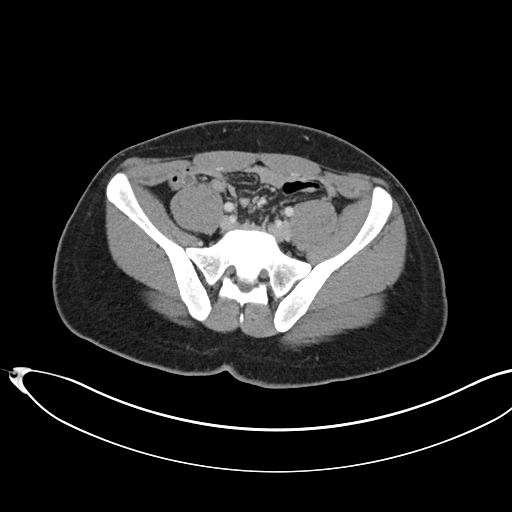
[im 52/104  soft-tissue]
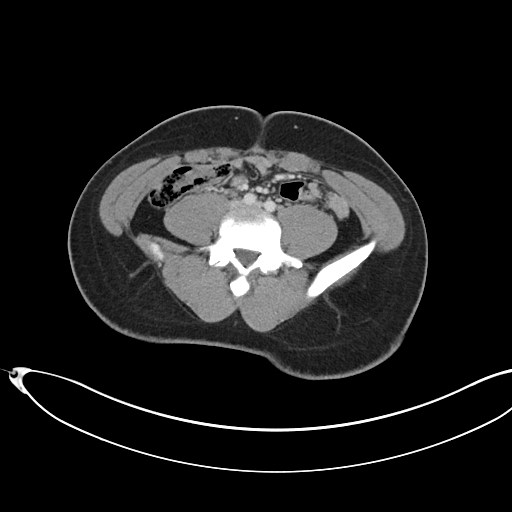
[im 61/104  soft-tissue]
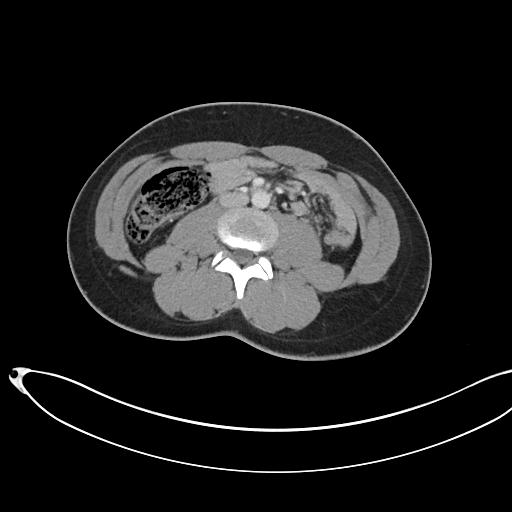
[im 69/104  soft-tissue]
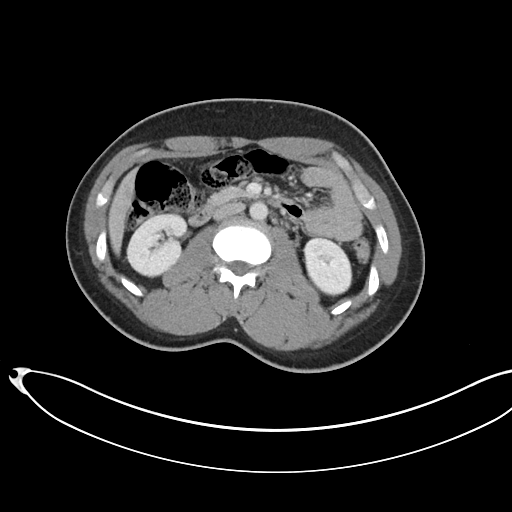
[im 69/104  bone]
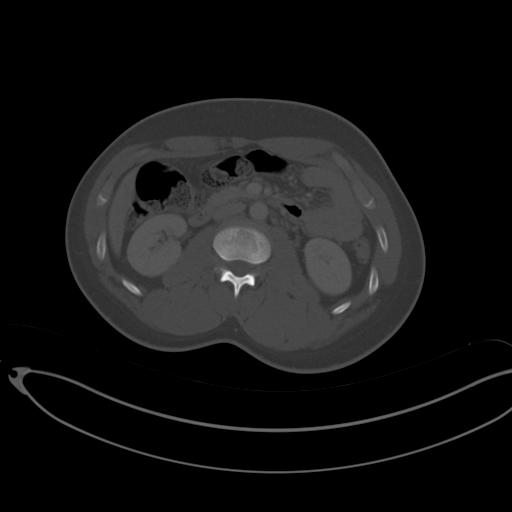
[im 73/104  soft-tissue]
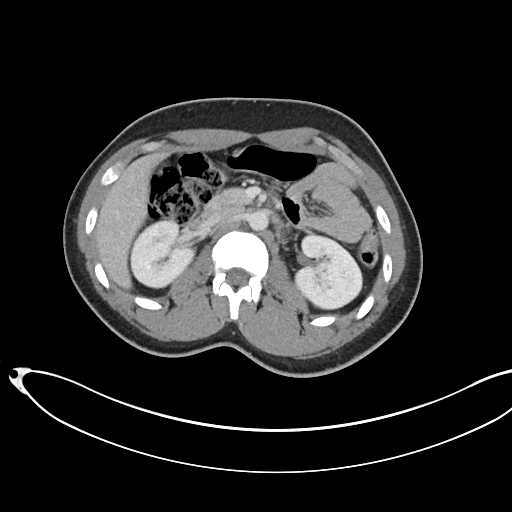
[im 82/104  soft-tissue]
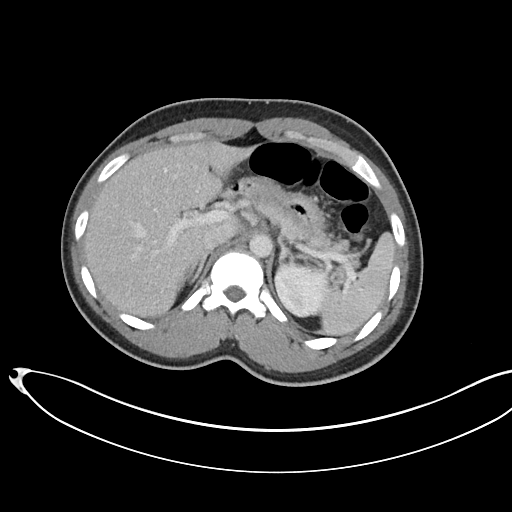
[im 91/104  soft-tissue]
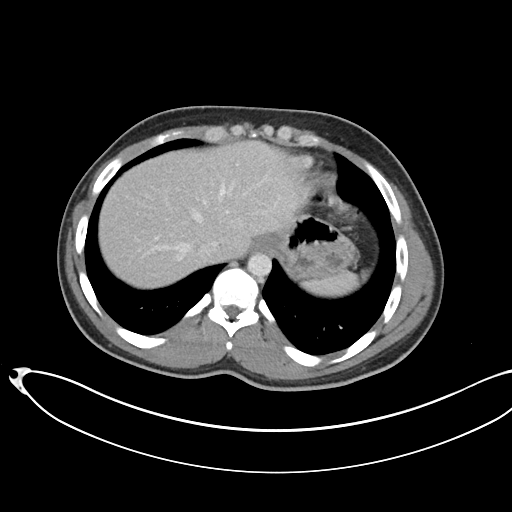
[im 99/104  soft-tissue]
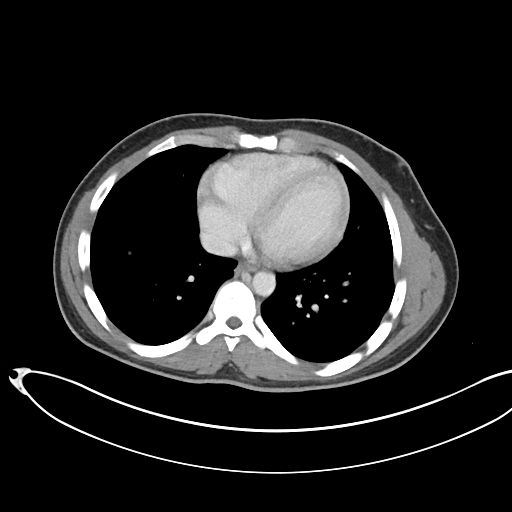

[Series 6: coronal soft tissue · coronal · 0.88mm/px · 3 of 99 slices shown]
[im 33/99  soft-tissue]
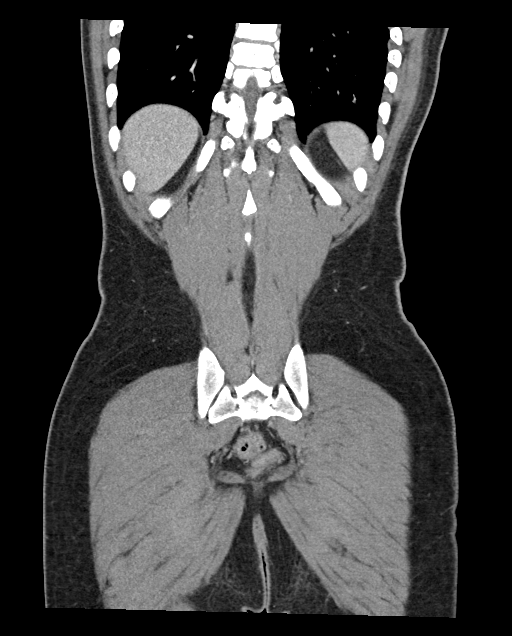
[im 44/99  soft-tissue]
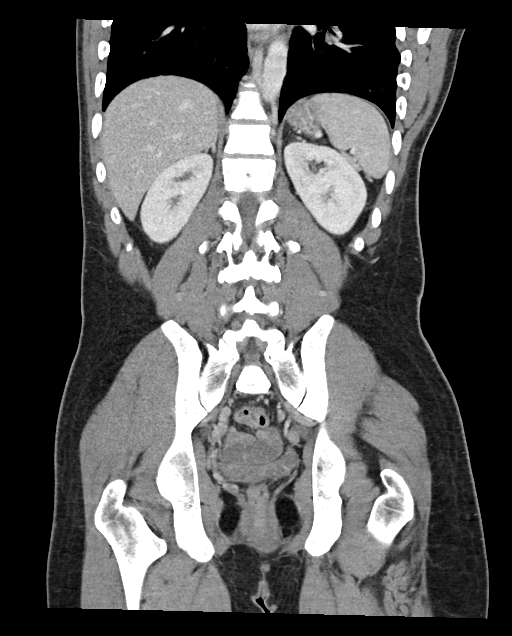
[im 55/99  soft-tissue]
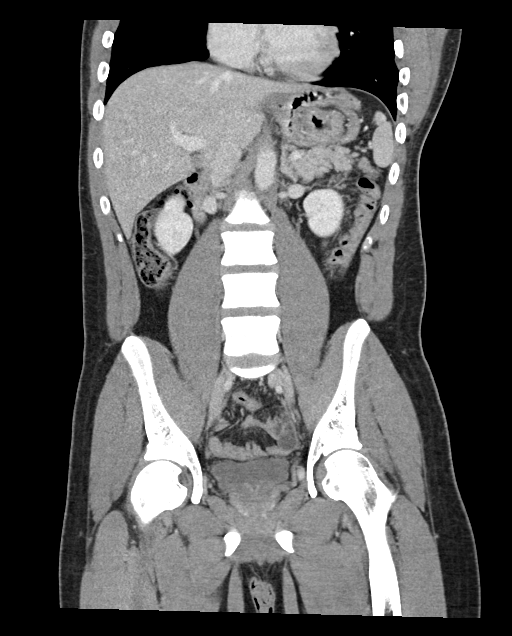

[16 of 46 positions shown; findings below may reference images not displayed]

FINDINGS: Lower chest: Lung bases are clear.

Hepatobiliary: Liver is within normal limits.

Gallbladder is unremarkable. No intrahepatic or extrahepatic ductal
dilatation.

Pancreas: Within normal limits.

Spleen: Normal limits.

Adrenals/Urinary Tract: Adrenal glands are within normal limits.

Kidneys are within normal limits.  No hydronephrosis.

Bladder is within normal limits.

Stomach/Bowel: Stomach is within normal limits.

No evidence of bowel obstruction.

Abnormal appendix, with 3 mm proximal appendicolith (series 3/image
58), and dilated proximal/mid appendix measuring up to 10 mm (series
3/image 65), with mucosal hyperenhancement and possible mild
periappendiceal inflammatory changes (series 3/image 70). This
appearance suggests mild acute appendicitis.

No drainable fluid collection/abscess. No free air to suggest
macroscopic perforation.

Vascular/Lymphatic: No evidence of abdominal aortic aneurysm.

No suspicious abdominopelvic lymphadenopathy.

Reproductive: Prostate is unremarkable.

Other: No abdominopelvic ascites.

Musculoskeletal: Visualized osseous structures are within normal
limits.
IMPRESSION: Mild acute appendicitis, without evidence of perforation.

Suspected 3 mm proximal appendicolith.
